# Patient Record
Sex: Female | Born: 1985 | Race: Black or African American | Hispanic: No | Marital: Married | Smoking: Never smoker
Health system: Southern US, Community
[De-identification: ages and names within clinical notes are randomized; demographics above are authoritative.]

## PROBLEM LIST (undated history)

## (undated) HISTORY — PX: ECTOPIC PREGNANCY SURGERY: SHX613

---

## 2004-06-19 ENCOUNTER — Inpatient Hospital Stay: Payer: Self-pay

## 2005-05-05 ENCOUNTER — Emergency Department: Payer: Self-pay | Admitting: Emergency Medicine

## 2006-06-24 ENCOUNTER — Emergency Department: Payer: Self-pay

## 2007-09-07 ENCOUNTER — Emergency Department: Payer: Self-pay | Admitting: Emergency Medicine

## 2007-09-08 ENCOUNTER — Ambulatory Visit: Payer: Self-pay | Admitting: Emergency Medicine

## 2007-09-26 ENCOUNTER — Emergency Department: Payer: Self-pay | Admitting: Emergency Medicine

## 2008-04-23 ENCOUNTER — Inpatient Hospital Stay: Payer: Self-pay | Admitting: Obstetrics and Gynecology

## 2010-12-04 ENCOUNTER — Ambulatory Visit: Payer: Self-pay | Admitting: Internal Medicine

## 2012-05-15 ENCOUNTER — Emergency Department: Payer: Self-pay | Admitting: Emergency Medicine

## 2012-05-15 LAB — COMPREHENSIVE METABOLIC PANEL
Alkaline Phosphatase: 130 U/L (ref 50–136)
Anion Gap: 8 (ref 7–16)
Bilirubin,Total: 0.4 mg/dL (ref 0.2–1.0)
Calcium, Total: 9 mg/dL (ref 8.5–10.1)
Chloride: 111 mmol/L — ABNORMAL HIGH (ref 98–107)
Co2: 24 mmol/L (ref 21–32)
Creatinine: 0.77 mg/dL (ref 0.60–1.30)
EGFR (African American): >60
Osmolality: 287 (ref 275–301)
Potassium: 3.8 mmol/L (ref 3.5–5.1)
Sodium: 143 mmol/L (ref 136–145)

## 2012-05-15 LAB — LIPASE, BLOOD: Lipase: 90 U/L (ref 73–393)

## 2012-05-15 LAB — CBC
HCT: 44.9 % (ref 35.0–47.0)
MCH: 26.7 pg (ref 26.0–34.0)
MCHC: 32.2 g/dL (ref 32.0–36.0)
Platelet: 322 10*3/uL (ref 150–440)

## 2013-03-22 ENCOUNTER — Emergency Department: Payer: Self-pay | Admitting: Emergency Medicine

## 2013-03-22 LAB — BASIC METABOLIC PANEL
Anion Gap: 4 — ABNORMAL LOW (ref 7–16)
BUN: 6 mg/dL — ABNORMAL LOW (ref 7–18)
Calcium, Total: 8.5 mg/dL (ref 8.5–10.1)
Co2: 22 mmol/L (ref 21–32)
EGFR (African American): >60
Glucose: 91 mg/dL (ref 65–99)

## 2013-03-22 LAB — CBC WITH DIFFERENTIAL/PLATELET
Basophil #: 0.1 10*3/uL (ref 0.0–0.1)
Eosinophil #: 0 10*3/uL (ref 0.0–0.7)
HGB: 13.4 g/dL (ref 12.0–16.0)
Lymphocyte #: 1.7 10*3/uL (ref 1.0–3.6)
Lymphocyte %: 18.1 %
MCH: 27.4 pg (ref 26.0–34.0)
MCHC: 33.6 g/dL (ref 32.0–36.0)
RBC: 4.88 10*6/uL (ref 3.80–5.20)
RDW: 14.6 % — ABNORMAL HIGH (ref 11.5–14.5)

## 2015-01-03 ENCOUNTER — Emergency Department
Admission: EM | Admit: 2015-01-03 | Discharge: 2015-01-03 | Disposition: A | Discharge status: Home / Self Care | Payer: Self-pay | Attending: Emergency Medicine | Admitting: Emergency Medicine

## 2015-01-03 ENCOUNTER — Encounter: Payer: Self-pay | Admitting: *Deleted

## 2015-01-03 ENCOUNTER — Emergency Department: Payer: Self-pay

## 2015-01-03 DIAGNOSIS — Z3202 Encounter for pregnancy test, result negative: Secondary | ICD-10-CM | POA: Insufficient documentation

## 2015-01-03 DIAGNOSIS — R1031 Right lower quadrant pain: Secondary | ICD-10-CM | POA: Insufficient documentation

## 2015-01-03 DIAGNOSIS — R109 Unspecified abdominal pain: Secondary | ICD-10-CM

## 2015-01-03 LAB — LIPASE, BLOOD: LIPASE: 23 U/L (ref 22–51)

## 2015-01-03 LAB — URINALYSIS COMPLETE WITH MICROSCOPIC (ARMC ONLY)
BILIRUBIN URINE: NEGATIVE
GLUCOSE, UA: NEGATIVE mg/dL
Ketones, ur: NEGATIVE mg/dL
Leukocytes, UA: NEGATIVE
Nitrite: NEGATIVE
Protein, ur: NEGATIVE mg/dL
Specific Gravity, Urine: 1.027 (ref 1.005–1.030)
pH: 5 (ref 5.0–8.0)

## 2015-01-03 LAB — CBC
HEMATOCRIT: 39.3 % (ref 35.0–47.0)
Hemoglobin: 12.8 g/dL (ref 12.0–16.0)
MCH: 27.1 pg (ref 26.0–34.0)
MCHC: 32.5 g/dL (ref 32.0–36.0)
MCV: 83.3 fL (ref 80.0–100.0)
PLATELETS: 308 10*3/uL (ref 150–440)
RBC: 4.72 MIL/uL (ref 3.80–5.20)
RDW: 14.2 % (ref 11.5–14.5)
WBC: 8.7 10*3/uL (ref 3.6–11.0)

## 2015-01-03 LAB — COMPREHENSIVE METABOLIC PANEL
ALK PHOS: 98 U/L (ref 38–126)
ALT: 16 U/L (ref 14–54)
AST: 20 U/L (ref 15–41)
Albumin: 4.1 g/dL (ref 3.5–5.0)
Anion gap: 7 (ref 5–15)
BILIRUBIN TOTAL: 0.5 mg/dL (ref 0.3–1.2)
BUN: 12 mg/dL (ref 6–20)
CALCIUM: 8.8 mg/dL — AB (ref 8.9–10.3)
CO2: 24 mmol/L (ref 22–32)
CREATININE: 0.68 mg/dL (ref 0.44–1.00)
Chloride: 107 mmol/L (ref 101–111)
Glucose, Bld: 105 mg/dL — ABNORMAL HIGH (ref 65–99)
Potassium: 3.6 mmol/L (ref 3.5–5.1)
Sodium: 138 mmol/L (ref 135–145)
TOTAL PROTEIN: 7.8 g/dL (ref 6.5–8.1)

## 2015-01-03 LAB — WET PREP, GENITAL
TRICH WET PREP: NONE SEEN
YEAST WET PREP: NONE SEEN

## 2015-01-03 LAB — CHLAMYDIA/NGC RT PCR (ARMC ONLY)
Chlamydia Tr: NOT DETECTED
N gonorrhoeae: NOT DETECTED

## 2015-01-03 LAB — POCT PREGNANCY, URINE: PREG TEST UR: NEGATIVE

## 2015-01-03 MED ORDER — TRAMADOL HCL 50 MG PO TABS: 50.0000 mg | ORAL_TABLET | Freq: Four times a day (QID) | ORAL | Status: AC | PRN

## 2015-01-03 NOTE — ED Provider Notes (Signed)
[facility] Emergency Department Provider Note  ____________________________________________  Time seen: On arrival  I have reviewed the triage vital signs and the nursing notes.   HISTORY  Chief Complaint Abdominal Pain    HPI Stephanie Christensen is a 29 y.o. female who presents with complaints of mild right sided abdominal pain. She reports it started yesterday and continued through the night. She denies fevers chills. No nausea no vomiting. No dysuria. No back pain. She reports a history of an ectopic pregnancy has been her only surgery. She is on Depo-Provera. She denies vaginal discharge. She believes that she is constipated     History reviewed. No pertinent past medical history.  There are no active problems to display for this patient.   History reviewed. No pertinent past surgical history.  Current Outpatient Rx  Name  Route  Sig  Dispense  Refill  . traMADol (ULTRAM) 50 MG tablet   Oral   Take 1 tablet (50 mg total) by mouth every 6 (six) hours as needed.   20 tablet   0     Allergies Review of patient's allergies indicates no known allergies.  No family history on file.  Social History Social History  Substance Use Topics  . Smoking status: Never Smoker   . Smokeless tobacco: Never Used  . Alcohol Use: Yes    Review of Systems  Constitutional: Negative for fever. Eyes: Negative for visual changes. ENT: Negative for sore throat Cardiovascular: Negative for chest pain. Respiratory: Negative for shortness of breath. Gastrointestinal: Negative for vomiting and diarrhea. Genitourinary: Negative for dysuria. Musculoskeletal: Negative for back pain. Skin: Negative for rash. Neurological: Negative for headaches  Psychiatric: No anxiety    ____________________________________________   PHYSICAL EXAM:  VITAL SIGNS: ED Triage Vitals  Enc Vitals Group     BP 01/03/15 0747 147/90 mmHg     Pulse Rate 01/03/15 0747 111    Resp 01/03/15 0747 18     Temp 01/03/15 0747 98.2 F (36.8 C)     Temp src --      SpO2 01/03/15 0747 98 %     Weight 01/03/15 0747 175 lb (79.379 kg)     Height 01/03/15 0747 5\' 3"  (1.6 m)     Head Cir --      Peak Flow --      Pain Score 01/03/15 0748 8     Pain Loc --      Pain Edu? --      Excl. in GC? --     Constitutional: Alert and oriented. Well appearing and in no distress. Eyes: Conjunctivae are normal.  ENT   Head: Normocephalic and atraumatic.   Mouth/Throat: Mucous membranes are moist. Cardiovascular: Normal rate, regular rhythm. Normal and symmetric distal pulses are present in all extremities. No murmurs, rubs, or gallops. Respiratory: Normal respiratory effort without tachypnea nor retractions. Breath sounds are clear and equal bilaterally.  Gastrointestinal: Soft and non-tender in all quadrants. No distention. There is no CVA tenderness. Benign abdominal exam Genitourinary: No cervicitis noted, no CMT Musculoskeletal: Nontender with normal range of motion in all extremities. No lower extremity tenderness nor edema. Neurologic:  Normal speech and language. No gross focal neurologic deficits are appreciated. Skin:  Skin is warm, dry and intact. No rash noted. Psychiatric: Mood and affect are normal. Patient exhibits appropriate insight and judgment.  ____________________________________________    LABS (pertinent positives/negatives)  Labs Reviewed  WET PREP, GENITAL - Abnormal; Notable for the following:  Clue Cells Wet Prep HPF POC FEW (*)    WBC, Wet Prep HPF POC MODERATE (*)    All other components within normal limits  COMPREHENSIVE METABOLIC PANEL - Abnormal; Notable for the following:    Glucose, Bld 105 (*)    Calcium 8.8 (*)    All other components within normal limits  URINALYSIS COMPLETEWITH MICROSCOPIC ([facility] ONLY) - Abnormal; Notable for the following:    Color, Urine YELLOW (*)    APPearance CLEAR (*)    Hgb urine dipstick 2+ (*)     Bacteria, UA RARE (*)    Squamous Epithelial / LPF 0-5 (*)    All other components within normal limits  CHLAMYDIA/NGC RT PCR ([facility] ONLY)  CBC  LIPASE, BLOOD  POCT PREGNANCY, URINE    ____________________________________________   EKG  None  ____________________________________________    RADIOLOGY I have personally reviewed any xrays that were ordered on this patient: None  ____________________________________________   PROCEDURES  Procedure(s) performed: none  Critical Care performed: none  ____________________________________________   INITIAL IMPRESSION / ASSESSMENT AND PLAN / ED COURSE  Pertinent labs & imaging results that were available during my care of the patient were reviewed by me and considered in my medical decision making (see chart for details).  Patient well-appearing and in no distress. Heart rate is 95 on my exam. She has no abdominal tenderness to palpation. We will check labs, urine and re-evaluate  ----------------------------------------- 11:08 AM on 01/03/2015 -----------------------------------------  Patient reports feeling slightly better. Her workup has been essentially benign area white blood cells noted on wet prep, we will call the patient when chlamydia and gonorrhea labs return. She has no ovary on the right she reports sotorsion is not a concern.  ____________________________________________   FINAL CLINICAL IMPRESSION(S) / ED DIAGNOSES  Final diagnoses:  Flank pain, acute  Right lower quadrant abdominal pain     Jene Every, MD 01/03/15 [phone]

## 2015-01-03 NOTE — ED Notes (Signed)
MD at bedside. 

## 2015-01-03 NOTE — Discharge Instructions (Signed)
Abdominal Pain, Women °Abdominal (stomach, pelvic, or belly) pain can be caused by many things. It is important to tell your doctor: °· The location of the pain. °· Does it come and go or is it present all the time? °· Are there things that start the pain (eating certain foods, exercise)? °· Are there other symptoms associated with the pain (fever, nausea, vomiting, diarrhea)? °All of this is helpful to know when trying to find the cause of the pain. °CAUSES  °· Stomach: virus or bacteria infection, or ulcer. °· Intestine: appendicitis (inflamed appendix), regional ileitis (Crohn's disease), ulcerative colitis (inflamed colon), irritable bowel syndrome, diverticulitis (inflamed diverticulum of the colon), or cancer of the stomach or intestine. °· Gallbladder disease or stones in the gallbladder. °· Kidney disease, kidney stones, or infection. °· Pancreas infection or cancer. °· Fibromyalgia (pain disorder). °· Diseases of the female organs: °¨ Uterus: fibroid (non-cancerous) tumors or infection. °¨ Fallopian tubes: infection or tubal pregnancy. °¨ Ovary: cysts or tumors. °¨ Pelvic adhesions (scar tissue). °¨ Endometriosis (uterus lining tissue growing in the pelvis and on the pelvic organs). °¨ Pelvic congestion syndrome (female organs filling up with blood just before the menstrual period). °¨ Pain with the menstrual period. °¨ Pain with ovulation (producing an egg). °¨ Pain with an IUD (intrauterine device, birth control) in the uterus. °¨ Cancer of the female organs. °· Functional pain (pain not caused by a disease, may improve without treatment). °· Psychological pain. °· Depression. °DIAGNOSIS  °Your doctor will decide the seriousness of your pain by doing an examination. °· Blood tests. °· X-rays. °· Ultrasound. °· CT scan (computed tomography, special type of X-ray). °· MRI (magnetic resonance imaging). °· Cultures, for infection. °· Barium enema (dye inserted in the large intestine, to better view it with  X-rays). °· Colonoscopy (looking in intestine with a lighted tube). °· Laparoscopy (minor surgery, looking in abdomen with a lighted tube). °· Major abdominal exploratory surgery (looking in abdomen with a large incision). °TREATMENT  °The treatment will depend on the cause of the pain.  °· Many cases can be observed and treated at home. °· Over-the-counter medicines recommended by your caregiver. °· Prescription medicine. °· Antibiotics, for infection. °· Birth control pills, for painful periods or for ovulation pain. °· Hormone treatment, for endometriosis. °· Nerve blocking injections. °· Physical therapy. °· Antidepressants. °· Counseling with a psychologist or psychiatrist. °· Minor or major surgery. °HOME CARE INSTRUCTIONS  °· Do not take laxatives, unless directed by your caregiver. °· Take over-the-counter pain medicine only if ordered by your caregiver. Do not take aspirin because it can cause an upset stomach or bleeding. °· Try a clear liquid diet (broth or water) as ordered by your caregiver. Slowly move to a bland diet, as tolerated, if the pain is related to the stomach or intestine. °· Have a thermometer and take your temperature several times a day, and record it. °· Bed rest and sleep, if it helps the pain. °· Avoid sexual intercourse, if it causes pain. °· Avoid stressful situations. °· Keep your follow-up appointments and tests, as your caregiver orders. °· If the pain does not go away with medicine or surgery, you may try: °¨ Acupuncture. °¨ Relaxation exercises (yoga, meditation). °¨ Group therapy. °¨ Counseling. °SEEK MEDICAL CARE IF:  °· You notice certain foods cause stomach pain. °· Your home care treatment is not helping your pain. °· You need stronger pain medicine. °· You want your IUD removed. °· You feel faint or   lightheaded. °· You develop nausea and vomiting. °· You develop a rash. °· You are having side effects or an allergy to your medicine. °SEEK IMMEDIATE MEDICAL CARE IF:  °· Your  pain does not go away or gets worse. °· You have a fever. °· Your pain is felt only in portions of the abdomen. The right side could possibly be appendicitis. The left lower portion of the abdomen could be colitis or diverticulitis. °· You are passing blood in your stools (bright red or black tarry stools, with or without vomiting). °· You have blood in your urine. °· You develop chills, with or without a fever. °· You pass out. °MAKE SURE YOU:  °· Understand these instructions. °· Will watch your condition. °· Will get help right away if you are not doing well or get worse. °Document Released: 03/04/2007 Document Revised: 09/21/2013 Document Reviewed: 03/24/2009 °[employer]® Patient Information ©2015 [employer]. This information is not intended to replace advice given to you by your health care provider. Make sure you discuss any questions you have with your health care provider. ° °

## 2015-01-03 NOTE — ED Notes (Signed)
Pt reports right sided abdominal pain radiating to back, pt reports being constipated,

## 2016-09-01 ENCOUNTER — Emergency Department
Admission: EM | Admit: 2016-09-01 | Discharge: 2016-09-01 | Disposition: A | Discharge status: Home / Self Care | Payer: BLUE CROSS/BLUE SHIELD | Attending: Emergency Medicine | Admitting: Emergency Medicine

## 2016-09-01 ENCOUNTER — Encounter: Payer: Self-pay | Admitting: Emergency Medicine

## 2016-09-01 DIAGNOSIS — Y999 Unspecified external cause status: Secondary | ICD-10-CM | POA: Diagnosis not present

## 2016-09-01 DIAGNOSIS — X58XXXA Exposure to other specified factors, initial encounter: Secondary | ICD-10-CM | POA: Insufficient documentation

## 2016-09-01 DIAGNOSIS — Y929 Unspecified place or not applicable: Secondary | ICD-10-CM | POA: Diagnosis not present

## 2016-09-01 DIAGNOSIS — Y939 Activity, unspecified: Secondary | ICD-10-CM | POA: Diagnosis not present

## 2016-09-01 DIAGNOSIS — S0592XA Unspecified injury of left eye and orbit, initial encounter: Secondary | ICD-10-CM | POA: Diagnosis present

## 2016-09-01 DIAGNOSIS — S0502XA Injury of conjunctiva and corneal abrasion without foreign body, left eye, initial encounter: Secondary | ICD-10-CM | POA: Diagnosis not present

## 2016-09-01 MED ORDER — TOBRAMYCIN 0.3 % OP SOLN: 2.0000 [drp] | OPHTHALMIC | 0 refills | Status: DC

## 2016-09-01 MED ORDER — FLUORESCEIN SODIUM 0.6 MG OP STRP
1.0000 | ORAL_STRIP | Freq: Once | OPHTHALMIC | Status: AC
  Administered 2016-09-01: 1 via OPHTHALMIC

## 2016-09-01 MED ORDER — TETRACAINE HCL 0.5 % OP SOLN
1.0000 [drp] | Freq: Once | OPHTHALMIC | Status: AC
  Administered 2016-09-01: 1 [drp] via OPHTHALMIC
  Filled 2016-09-01: qty 2

## 2016-09-01 MED ORDER — EYE WASH OPHTH SOLN
1.0000 [drp] | OPHTHALMIC | Status: DC | PRN
  Administered 2016-09-01: 1 [drp] via OPHTHALMIC
  Filled 2016-09-01: qty 118

## 2016-09-01 MED ORDER — FLUORESCEIN SODIUM 1 MG OP STRP
ORAL_STRIP | OPHTHALMIC | Status: AC
  Filled 2016-09-01: qty 1

## 2016-09-01 NOTE — ED Notes (Signed)
NAD noted at time of D/C. Pt denies questions or concerns. Pt ambulatory to the lobby at this time.  

## 2016-09-01 NOTE — ED Triage Notes (Signed)
States awoke yesterday am with L eye pain. States it feels like she has something in it but does not know of getting something in it. Sclera noted reddened.

## 2016-09-01 NOTE — Discharge Instructions (Signed)
Follow-up with [facility] if any continued problems on Monday. You will need to call and make an appointment. Use Tobrex ophthalmic solution 2 drops to your left eye every 4 hours while awake. You may take Tylenol or ibuprofen as needed for pain. Continue wearing your sunglasses for protection from bright lights.

## 2016-09-01 NOTE — ED Provider Notes (Signed)
[facility] Emergency Department Provider Note  ____________________________________________   First MD Initiated Contact with Patient 09/01/16 1218     (approximate)  I have reviewed the triage vital signs and the nursing notes.   HISTORY  Chief Complaint Eye Pain    HPI Stephanie Christensen is a 31 y.o. female is here complaining of left eye pain. Patient states that it began feeling like there was something in her eye. She went to her PCPs office yesterday where she had an eye exam. She was told that she did not have a corneal abrasion however this is Her awake most of the night and she continues to have photophobia. Patient states that she normally wears eyeglasses but these are broken and so she currently has nothing to wear. Patient denies the use of contacts. She denies any fever. She denies any previous eye problems or injuries. Patient rates her pain as 10 over 10. She states that she was not given any medication at her PCPs office.   History reviewed. No pertinent past medical history.  There are no active problems to display for this patient.   Past Surgical History:  Procedure Laterality Date  . ECTOPIC PREGNANCY SURGERY      Prior to Admission medications   Medication Sig Start Date End Date Taking? Authorizing Provider  tobramycin (TOBREX) 0.3 % ophthalmic solution Place 2 drops into the left eye every 4 (four) hours. 09/01/16   Tommi Rumps, PA-C    Allergies Patient has no known allergies.  No family history on file.  Social History Social History  Substance Use Topics  . Smoking status: Never Smoker  . Smokeless tobacco: Never Used  . Alcohol use Yes    Review of Systems Constitutional: No fever/chills Eyes: Left eye pain. ENT: No sore throat. Cardiovascular: Denies chest pain. Respiratory: Denies shortness of breath. Musculoskeletal: Negative for back pain. Neurological: Negative for headaches, focal weakness or  numbness.  10-point ROS otherwise negative.  ____________________________________________   PHYSICAL EXAM:  VITAL SIGNS: ED Triage Vitals  Enc Vitals Group     BP 09/01/16 1138 (!) 140/93     Pulse Rate 09/01/16 1138 91     Resp 09/01/16 1138 18     Temp 09/01/16 1138 98.6 F (37 C)     Temp Source 09/01/16 1138 Oral     SpO2 09/01/16 1138 100 %     Weight 09/01/16 1138 181 lb (82.1 kg)     Height 09/01/16 1138  (1.6 m)     Head Circumference --      Peak Flow --      Pain Score 09/01/16 1137 10     Pain Loc --      Pain Edu? --      Excl. in GC? --     Constitutional: Alert and oriented. Well appearing and in no acute distress.Patient is wearing sunglasses in the exam room. Eyes: Conjunctivae are normal for the right eye. Left eye is moderately injected.Marland Kitchen PERRL. EOMI. Head: Atraumatic. Nose: No congestion/rhinnorhea. Neck: No stridor.   Cardiovascular: Normal rate, regular rhythm. Grossly normal heart sounds.  Good peripheral circulation. Respiratory: Normal respiratory effort.  No retractions. Lungs CTAB. Musculoskeletal: Moves upper and lower 70s without difficulty. Normal gait was noted. Neurologic:  Normal speech and language. No gross focal neurologic deficits are appreciated. No gait instability. Skin:  Skin is warm, dry and intact. No rash noted. Psychiatric: Mood and affect are normal. Speech and behavior are  normal.  ____________________________________________   LABS (all labs ordered are listed, but only abnormal results are displayed)  Labs Reviewed - No data to display  PROCEDURES  Procedure(s) performed: Tetracaine was placed in the patient's left eye. Examination not show any foreign body. Fluorescein strip was applied. There was uptake on the cornea from approximately 3:00 position over to 9:00. There appears to be very superficial. Lid was inverted and no foreign body was noted. Eye was irrigated.  Procedures  Critical Care performed:  No  ____________________________________________   INITIAL IMPRESSION / ASSESSMENT AND PLAN / ED COURSE  Pertinent labs & imaging results that were available during my care of the patient were reviewed by me and considered in my medical decision making (see chart for details).  Patient was given a prescription for Tobrex ophthalmic solution 2 drops every 4 hours while awake. Patient may take Tylenol as needed for pain. She'll continue wearing her sunglasses as needed for protection from bright light. She is to follow-up with Dr. Sharman Crate who is on-call for [facility] if any continued problems on Monday.      ____________________________________________   FINAL CLINICAL IMPRESSION(S) / ED DIAGNOSES  Final diagnoses:  Abrasion of left cornea, initial encounter      NEW MEDICATIONS STARTED DURING THIS VISIT:  Discharge Medication List as of 09/01/2016 12:43 PM    START taking these medications   Details  tobramycin (TOBREX) 0.3 % ophthalmic solution Place 2 drops into the left eye every 4 (four) hours., Starting Sat 09/01/2016, Print         Note:  This document was prepared using Dragon voice recognition software and may include unintentional dictation errors.    Tommi Rumps, PA-C 09/01/16 1522    Nita Sickle, MD 09/03/16 [phone]

## 2016-10-21 ENCOUNTER — Encounter: Payer: Self-pay | Admitting: Emergency Medicine

## 2016-10-21 ENCOUNTER — Emergency Department
Admission: EM | Admit: 2016-10-21 | Discharge: 2016-10-21 | Disposition: A | Discharge status: Home / Self Care | Payer: BLUE CROSS/BLUE SHIELD | Attending: Emergency Medicine | Admitting: Emergency Medicine

## 2016-10-21 DIAGNOSIS — R002 Palpitations: Secondary | ICD-10-CM | POA: Diagnosis present

## 2016-10-21 LAB — CBC WITH DIFFERENTIAL/PLATELET
Basophils Absolute: 0.1 10*3/uL (ref 0–0.1)
Basophils Relative: 1 %
Eosinophils Absolute: 0 10*3/uL (ref 0–0.7)
Eosinophils Relative: 0 %
HEMATOCRIT: 40.5 % (ref 35.0–47.0)
HEMOGLOBIN: 13.3 g/dL (ref 12.0–16.0)
LYMPHS ABS: 3 10*3/uL (ref 1.0–3.6)
LYMPHS PCT: 32 %
MCH: 26.5 pg (ref 26.0–34.0)
MCHC: 32.9 g/dL (ref 32.0–36.0)
MCV: 80.6 fL (ref 80.0–100.0)
Monocytes Absolute: 0.9 10*3/uL (ref 0.2–0.9)
Monocytes Relative: 9 %
NEUTROS ABS: 5.5 10*3/uL (ref 1.4–6.5)
Neutrophils Relative %: 58 %
Platelets: 311 10*3/uL (ref 150–440)
RBC: 5.02 MIL/uL (ref 3.80–5.20)
RDW: 14.7 % — ABNORMAL HIGH (ref 11.5–14.5)
WBC: 9.5 10*3/uL (ref 3.6–11.0)

## 2016-10-21 LAB — COMPREHENSIVE METABOLIC PANEL
ALK PHOS: 97 U/L (ref 38–126)
ALT: 12 U/L — AB (ref 14–54)
AST: 16 U/L (ref 15–41)
Albumin: 4.2 g/dL (ref 3.5–5.0)
Anion gap: 6 (ref 5–15)
BILIRUBIN TOTAL: 0.4 mg/dL (ref 0.3–1.2)
BUN: 12 mg/dL (ref 6–20)
CALCIUM: 9.4 mg/dL (ref 8.9–10.3)
CHLORIDE: 108 mmol/L (ref 101–111)
CO2: 25 mmol/L (ref 22–32)
CREATININE: 0.83 mg/dL (ref 0.44–1.00)
Glucose, Bld: 98 mg/dL (ref 65–99)
Potassium: 3.9 mmol/L (ref 3.5–5.1)
Sodium: 139 mmol/L (ref 135–145)
Total Protein: 8.1 g/dL (ref 6.5–8.1)

## 2016-10-21 LAB — POCT PREGNANCY, URINE: Preg Test, Ur: NEGATIVE

## 2016-10-21 LAB — TROPONIN I

## 2016-10-21 NOTE — Discharge Instructions (Signed)
Please discuss getting a holter monitor (device to record your heart rhythm) with your primary care doctor. Please seek medical attention for any high fevers, chest pain, shortness of breath, change in behavior, persistent vomiting, bloody stool or any other new or concerning symptoms.

## 2016-10-21 NOTE — ED Triage Notes (Signed)
Patient states that she has been having hear palpitations times one month. Patient states that the palpitations started happing more frequently. Patient denies any chest pain.

## 2016-10-21 NOTE — ED Provider Notes (Signed)
[facility] Emergency Department Provider Note  _______________________________________   I have reviewed the triage vital signs and the nursing notes.   HISTORY  Chief Complaint Palpitations   History limited by: Not Limited   HPI Stephanie Christensen is a 31 y.o. female who presents to the emergency department today because of concerns for palpitations. The patient states that she has had them in the past but for the past month they've been more frequent. They're occurring daily. They're very brief in nature. Patient denies any pain or shortness of breath associated with it. She does states she has been under a lot of stress and anxiety recently.   History reviewed. No pertinent past medical history.  There are no active problems to display for this patient.   Past Surgical History:  Procedure Laterality Date  . ECTOPIC PREGNANCY SURGERY      Prior to Admission medications   Medication Sig Start Date End Date Taking? Authorizing Provider  tobramycin (TOBREX) 0.3 % ophthalmic solution Place 2 drops into the left eye every 4 (four) hours. 09/01/16   Tommi RumpsSummers, Rhonda L, PA-C    Allergies Patient has no known allergies.  No family history on file.  Social History Social History  Substance Use Topics  . Smoking status: Never Smoker  . Smokeless tobacco: Never Used  . Alcohol use Yes     Comment: occ    Review of Systems Constitutional: No fever/chills Eyes: No visual changes. ENT: No sore throat. Cardiovascular:Positive for palpitations Respiratory: Denies shortness of breath. Gastrointestinal: No abdominal pain.  No nausea, no vomiting.  No diarrhea.   Genitourinary: Negative for dysuria. Musculoskeletal: Negative for back pain. Skin: Negative for rash. Neurological: Negative for headaches, focal weakness or numbness.  ____________________________________________   PHYSICAL EXAM:  VITAL SIGNS: ED Triage Vitals [10/21/16 2145]  Enc  Vitals Group     BP (!) 141/86     Pulse Rate 95     Resp 18     Temp 98.3 F (36.8 C)     Temp Source Oral     SpO2 100 %     Weight 175 lb (79.4 kg)     Height 5\' 3"  (1.6 m)   Constitutional: Alert and oriented. Well appearing and in no distress. Eyes: Conjunctivae are normal.  ENT   Head: Normocephalic and atraumatic.   Nose: No congestion/rhinnorhea.   Mouth/Throat: Mucous membranes are moist.   Neck: No stridor. Hematological/Lymphatic/Immunilogical: No cervical lymphadenopathy. Cardiovascular: Normal rate, regular rhythm.  No murmurs, rubs, or gallops.  Respiratory: Normal respiratory effort without tachypnea nor retractions. Breath sounds are clear and equal bilaterally. No wheezes/rales/rhonchi. Gastrointestinal: Soft and non tender. No rebound. No guarding.  Genitourinary: Deferred Musculoskeletal: Normal range of motion in all extremities. No lower extremity edema. Neurologic:  Normal speech and language. No gross focal neurologic deficits are appreciated.  Skin:  Skin is warm, dry and intact. No rash noted. Psychiatric: Mood and affect are normal. Speech and behavior are normal. Patient exhibits appropriate insight and judgment.  ____________________________________________    LABS (pertinent positives/negatives)  Labs Reviewed  CBC WITH DIFFERENTIAL/PLATELET - Abnormal; Notable for the following:       Result Value   RDW 14.7 (*)    All other components within normal limits  COMPREHENSIVE METABOLIC PANEL - Abnormal; Notable for the following:    ALT 12 (*)    All other components within normal limits  TROPONIN I  POCT PREGNANCY, URINE     ____________________________________________  EKG  I, Phineas Semen, attending physician, personally viewed and interpreted this EKG  EKG Time: 2141 Rate: 100 Rhythm: normal sinus rhythm Axis: normal Intervals: qtc 438 QRS: narrow ST changes: no st elevation Impression: normal  ekg   ____________________________________________    RADIOLOGY  None  ____________________________________________   PROCEDURES  Procedures  ____________________________________________   INITIAL IMPRESSION / ASSESSMENT AND PLAN / ED COURSE  Pertinent labs & imaging results that were available during my care of the patient were reviewed by me and considered in my medical decision making (see chart for details).  Patient presented to the emergency department today with palpitations. EKG without any concerning findings. Blood work without any concerning electrolyte abnormalities. At this point I do feel patient is safe for discharge with follow-up with primary care. I did discuss Holter monitor with patient.  ____________________________________________   FINAL CLINICAL IMPRESSION(S) / ED DIAGNOSES  Final diagnoses:  Palpitations     Note: This dictation was prepared with Dragon dictation. Any transcriptional errors that result from this process are unintentional     Phineas Semen, MD 10/21/16 2254

## 2016-12-27 ENCOUNTER — Emergency Department
Admission: EM | Admit: 2016-12-27 | Discharge: 2016-12-27 | Disposition: A | Discharge status: Home / Self Care | Payer: BLUE CROSS/BLUE SHIELD | Attending: Emergency Medicine | Admitting: Emergency Medicine

## 2016-12-27 ENCOUNTER — Encounter: Payer: Self-pay | Admitting: Medical Oncology

## 2016-12-27 DIAGNOSIS — M79672 Pain in left foot: Secondary | ICD-10-CM

## 2016-12-27 MED ORDER — NAPROXEN 500 MG PO TABS
500.0000 mg | ORAL_TABLET | Freq: Two times a day (BID) | ORAL | Status: DC
Start: 2016-12-27 — End: 2017-09-17

## 2016-12-27 NOTE — ED Triage Notes (Signed)
Pt reports she began yesterday having numbness to her middle and 4th toe on her left foot. Denies injury/pain. Pt reports everything else is fine.

## 2016-12-27 NOTE — ED Notes (Signed)
Pt to ed with c/o left foot/toe numbness and tingling.  Pt states she noticed foot numbness last night, denies headache, denies other numbness or tingling to other parts of body. No facial drooping noted, no weakness noted.  Pt denies injury.

## 2016-12-27 NOTE — ED Provider Notes (Signed)
[facility] Emergency Department Provider Note   ____________________________________________   First MD Initiated Contact with Patient 12/27/16 [phone]     (approximate)  I have reviewed the triage vital signs and the nursing notes.   HISTORY  Chief Complaint Foot Pain    HPI Stephanie Christensen is a 31 y.o. female patient complaining of numbness dorsal aspect of the third and fourth digit left foot. Patient state the first time she wore high heels at work with prolonged standing. Patient stated initially there was pain followed by numbness. Patient thought she would get better overnight but awakened again with numbness and pain with ambulation and standing. Patient's states no pain at this time just numbness.   History reviewed. No pertinent past medical history.  There are no active problems to display for this patient.   Past Surgical History:  Procedure Laterality Date  . ECTOPIC PREGNANCY SURGERY      Prior to Admission medications   Medication Sig Start Date End Date Taking? Authorizing Provider  naproxen (NAPROSYN) 500 MG tablet Take 1 tablet (500 mg total) by mouth 2 (two) times daily with a meal. 12/27/16   Joni Reining, PA-C  tobramycin (TOBREX) 0.3 % ophthalmic solution Place 2 drops into the left eye every 4 (four) hours. 09/01/16   Tommi Rumps, PA-C    Allergies Patient has no known allergies.  No family history on file.  Social History Social History  Substance Use Topics  . Smoking status: Never Smoker  . Smokeless tobacco: Never Used  . Alcohol use Yes     Comment: occ    Review of Systems  Constitutional: No fever/chills Eyes: No visual changes. ENT: No sore throat. Cardiovascular: Denies chest pain. Respiratory: Denies shortness of breath. Gastrointestinal: No abdominal pain.  No nausea, no vomiting.  No diarrhea.  No constipation. Genitourinary: Negative for dysuria. Musculoskeletal: Negative for back  pain. Skin: Negative for rash. Neurological: Numbness 4th and 5th left foot ____________________________________________   PHYSICAL EXAM:  VITAL SIGNS: ED Triage Vitals  Enc Vitals Group     BP 12/27/16 0803 140/87     Pulse Rate 12/27/16 0803 100     Resp 12/27/16 0803 12     Temp 12/27/16 0803 98.8 F (37.1 C)     Temp Source 12/27/16 0803 Oral     SpO2 12/27/16 0803 100 %     Weight 12/27/16 0804 175 lb (79.4 kg)     Height 12/27/16 0804 5\' 3"  (1.6 m)     Head Circumference --      Peak Flow --      Pain Score 12/27/16 0805 0     Pain Loc --      Pain Edu? --      Excl. in GC? --     Constitutional: Alert and oriented. Well appearing and in no acute distress. Cardiovascular: Normal rate, regular rhythm. Grossly normal heart sounds.  Good peripheral circulation. Respiratory: Normal respiratory effort.  No retractions. Lungs CTAB. Musculoskeletal: Obvious deformity to the left foot. No obvious edema.  Neurologic:  Normal speech and language. No gross focal neurologic deficits are appreciated. No gait instability. Skin:  Skin is warm, dry and intact. No rash noted. Psychiatric: Mood and affect are normal. Speech and behavior are normal.  ____________________________________________   LABS (all labs ordered are listed, but only abnormal results are displayed)  Labs Reviewed - No data to display ____________________________________________  EKG   ____________________________________________  RADIOLOGY  No  results found.  ____________________________________________   PROCEDURES  Procedure(s) performed: None  Procedures  Critical Care performed: No  ____________________________________________   INITIAL IMPRESSION / ASSESSMENT AND PLAN / ED COURSE  Pertinent labs & imaging results that were available during my care of the patient were reviewed by me and considered in my medical decision making (see chart for details).  Patient presents for  intermitting pain and numbness to the left foot. Incident occurred status post. He wasn't prolonged standing at work. Patient complains consistent inflammatory process. No imaging required. Patient given discharge care instructions and a work note. Patient given a prescription for naproxen and advised to follow-up with podiatry is no improvement in one week.      ____________________________________________   FINAL CLINICAL IMPRESSION(S) / ED DIAGNOSES  Final diagnoses:  Foot pain, left      NEW MEDICATIONS STARTED DURING THIS VISIT:  Discharge Medication List as of 12/27/2016  8:30 AM    START taking these medications   Details  naproxen (NAPROSYN) 500 MG tablet Take 1 tablet (500 mg total) by mouth 2 (two) times daily with a meal., Starting Thu 12/27/2016, Print         Note:  This document was prepared using Dragon voice recognition software and may include unintentional dictation errors.    Joni ReiningSmith, Ronald K, PA-C 12/27/16 [id number]    Phineas SemenGoodman, Graydon, MD 12/27/16 [phone]

## 2017-09-17 ENCOUNTER — Emergency Department
Admission: EM | Admit: 2017-09-17 | Discharge: 2017-09-17 | Disposition: A | Discharge status: Home / Self Care | Payer: Managed Care, Other (non HMO) | Attending: Emergency Medicine | Admitting: Emergency Medicine

## 2017-09-17 ENCOUNTER — Emergency Department: Payer: Managed Care, Other (non HMO)

## 2017-09-17 ENCOUNTER — Other Ambulatory Visit: Payer: Self-pay

## 2017-09-17 DIAGNOSIS — M542 Cervicalgia: Secondary | ICD-10-CM | POA: Insufficient documentation

## 2017-09-17 MED ORDER — METHOCARBAMOL 500 MG PO TABS: 500.0000 mg | ORAL_TABLET | Freq: Four times a day (QID) | ORAL | 0 refills | Status: DC

## 2017-09-17 MED ORDER — MELOXICAM 15 MG PO TABS: 15.0000 mg | ORAL_TABLET | Freq: Every day | ORAL | 0 refills | Status: DC

## 2017-09-17 NOTE — ED Notes (Signed)
See triage note. Presents with pain to upper back/between shoulder blades for the past 2 months  Denies any injury that she is aware of .low grade fever noted on arrival  Is able to turn head and flex and extend her neck

## 2017-09-17 NOTE — ED Provider Notes (Signed)
[facility] Emergency Department Provider Note  ____________________________________________  Time seen: Approximately 7:12 PM  I have reviewed the triage vital signs and the nursing notes.   HISTORY  Chief Complaint Back Pain    HPI Stephanie Christensen is a 32 y.o. female who presents emergency department complaining of ongoing neck pain.  Patient reports that over the past 2 months she has had neck pain.  Origin was nontraumatic in nature.  Patient reports that she does start a new job, spending significant periods of her day on a computer.  Patient denies any radicular symptoms.  She denies any headache, visual changes, chest pain, shortness of breath.  Patient reports that it is an aching sensation.  Patient reports feeling a "bulge" in the back of her neck.  Patient does not believe that this initially arose with the onset of her pain however.  Patient is taken intermittent over-the-counter medication but no daily or chronic over-the-counter medications for this complaint.  No other complaints at this time.  History reviewed. No pertinent past medical history.  There are no active problems to display for this patient.   Past Surgical History:  Procedure Laterality Date  . ECTOPIC PREGNANCY SURGERY      Prior to Admission medications   Medication Sig Start Date End Date Taking? Authorizing Provider  meloxicam (MOBIC) 15 MG tablet Take 1 tablet (15 mg total) by mouth daily. 09/17/17   Cuthriell, Delorise Royals, PA-C  methocarbamol (ROBAXIN) 500 MG tablet Take 1 tablet (500 mg total) by mouth 4 (four) times daily. 09/17/17   Cuthriell, Delorise Royals, PA-C    Allergies Patient has no known allergies.  No family history on file.  Social History Social History   Tobacco Use  . Smoking status: Never Smoker  . Smokeless tobacco: Never Used  Substance Use Topics  . Alcohol use: Yes    Comment: occ  . Drug use: No     Review of Systems  Constitutional: No  fever/chills Eyes: No visual changes.  Cardiovascular: no chest pain. Respiratory: no cough. No SOB. Gastrointestinal: No abdominal pain.  No nausea, no vomiting.  Musculoskeletal: Ongoing neck pain x2 months. Skin: Negative for rash, abrasions, lacerations, ecchymosis. Neurological: Negative for headaches, focal weakness or numbness. 10-point ROS otherwise negative.  ____________________________________________   PHYSICAL EXAM:  VITAL SIGNS: ED Triage Vitals  Enc Vitals Group     BP 09/17/17 1838 140/88     Pulse Rate 09/17/17 1838 84     Resp 09/17/17 1838 18     Temp 09/17/17 1838 99.4 F (37.4 C)     Temp Source 09/17/17 1838 Oral     SpO2 09/17/17 1838 97 %     Weight 09/17/17 1837 180 lb (81.6 kg)     Height 09/17/17 1837  (1.6 m)     Head Circumference --      Peak Flow --      Pain Score 09/17/17 1842 6     Pain Loc --      Pain Edu? --      Excl. in GC? --      Constitutional: Alert and oriented. Well appearing and in no acute distress. Eyes: Conjunctivae are normal. PERRL. EOMI. Head: Atraumatic. ENT:      Ears:       Nose: No congestion/rhinnorhea.      Mouth/Throat: Mucous membranes are moist.  Neck: No stridor.  No cervical spine tenderness to palpation.  On palpation of cervical spine, exaggerated  lordosis is palpated, however no appreciable abnormality or step-off.  No tenderness to palpation of bilateral paraspinal muscle groups.  Full range of motion bilateral shoulders.  Radial pulse intact bilateral upper extremities.  Sensation intact and equal all dermatomal distributions bilateral upper extremities.  Cardiovascular: Normal rate, regular rhythm. Normal S1 and S2.  Good peripheral circulation. Respiratory: Normal respiratory effort without tachypnea or retractions. Lungs CTAB. Good air entry to the bases with no decreased or absent breath sounds. Musculoskeletal: Full range of motion to all extremities. No gross deformities  appreciated. Neurologic:  Normal speech and language. No gross focal neurologic deficits are appreciated.  Skin:  Skin is warm, dry and intact. No rash noted. Psychiatric: Mood and affect are normal. Speech and behavior are normal. Patient exhibits appropriate insight and judgement.   ____________________________________________   LABS (all labs ordered are listed, but only abnormal results are displayed)  Labs Reviewed - No data to display ____________________________________________  EKG   ____________________________________________  RADIOLOGY Festus Barren Cuthriell, personally viewed and evaluated these images (plain radiographs) as part of my medical decision making, as well as reviewing the written report by the radiologist.  Agree with radiologist finding of no acute osseous abnormality.  Dg Cervical Spine 2-3 Views  Result Date: 09/17/2017 CLINICAL DATA:  Neck pain x2 months, no known injury EXAM: CERVICAL SPINE - 2-3 VIEW COMPARISON:  None. FINDINGS: Normal cervical lordosis. No evidence of fracture or dislocation. Vertebral body heights and intervertebral disc spaces are maintained. Dens appears intact. Lateral masses of C1 are symmetric. No prevertebral soft tissue swelling. Visualized lung apices are clear. IMPRESSION: Negative cervical spine radiographs. Electronically Signed   By: Charline Bills M.D.   On: 09/17/2017 20:03    ____________________________________________    PROCEDURES  Procedure(s) performed:    Procedures    Medications - No data to display   ____________________________________________   INITIAL IMPRESSION / ASSESSMENT AND PLAN / ED COURSE  Pertinent labs & imaging results that were available during my care of the patient were reviewed by me and considered in my medical decision making (see chart for details).  Review of the [place] CSRS was performed in accordance of the [facility] prior to dispensing any controlled drugs.     Patient's  diagnosis is consistent with cervicalgia.  Patient presents with 41-month history of ongoing neck pain.  I felt that this was likely positional as patient started a new job, spending long hours but no her computer screen.  Exam is reassuring.  X-ray was ordered to evaluate spacing of the inter vertebral joint space.  X-ray reveals no acute osseous abnormality.  No indication for further work-up.. Patient will be discharged home with prescriptions for meloxicam and Robaxin. Patient is to follow up with primary care as needed or otherwise directed. Patient is given ED precautions to return to the ED for any worsening or new symptoms.     ____________________________________________  FINAL CLINICAL IMPRESSION(S) / ED DIAGNOSES  Final diagnoses:  Cervicalgia      NEW MEDICATIONS STARTED DURING THIS VISIT:  ED Discharge Orders        Ordered    meloxicam (MOBIC) 15 MG tablet  Daily     09/17/17 2024    methocarbamol (ROBAXIN) 500 MG tablet  4 times daily     09/17/17 2024          This chart was dictated using voice recognition software/Dragon. Despite best efforts to proofread, errors can occur which can change the meaning. Any change  was purely unintentional.    Racheal Patches, PA-C 09/17/17 2031    Minna Antis, MD 09/17/17 2328

## 2017-09-17 NOTE — ED Triage Notes (Signed)
Pt c/o upper back/shoulder and neck pain for the past 2 months.

## 2018-06-13 IMAGING — CR DG CERVICAL SPINE 2 OR 3 VIEWS
1 series · 4 of 4 positions shown · non-contrast
Comparison: None.

CLINICAL DATA: Neck pain x2 months, no known injury

EXAM:
CERVICAL SPINE - 2-3 VIEW

[Series 1: dg cervical spine 2 or 3 views · 0.14mm/px · 4 of 4 slices shown]
[im 1/4]
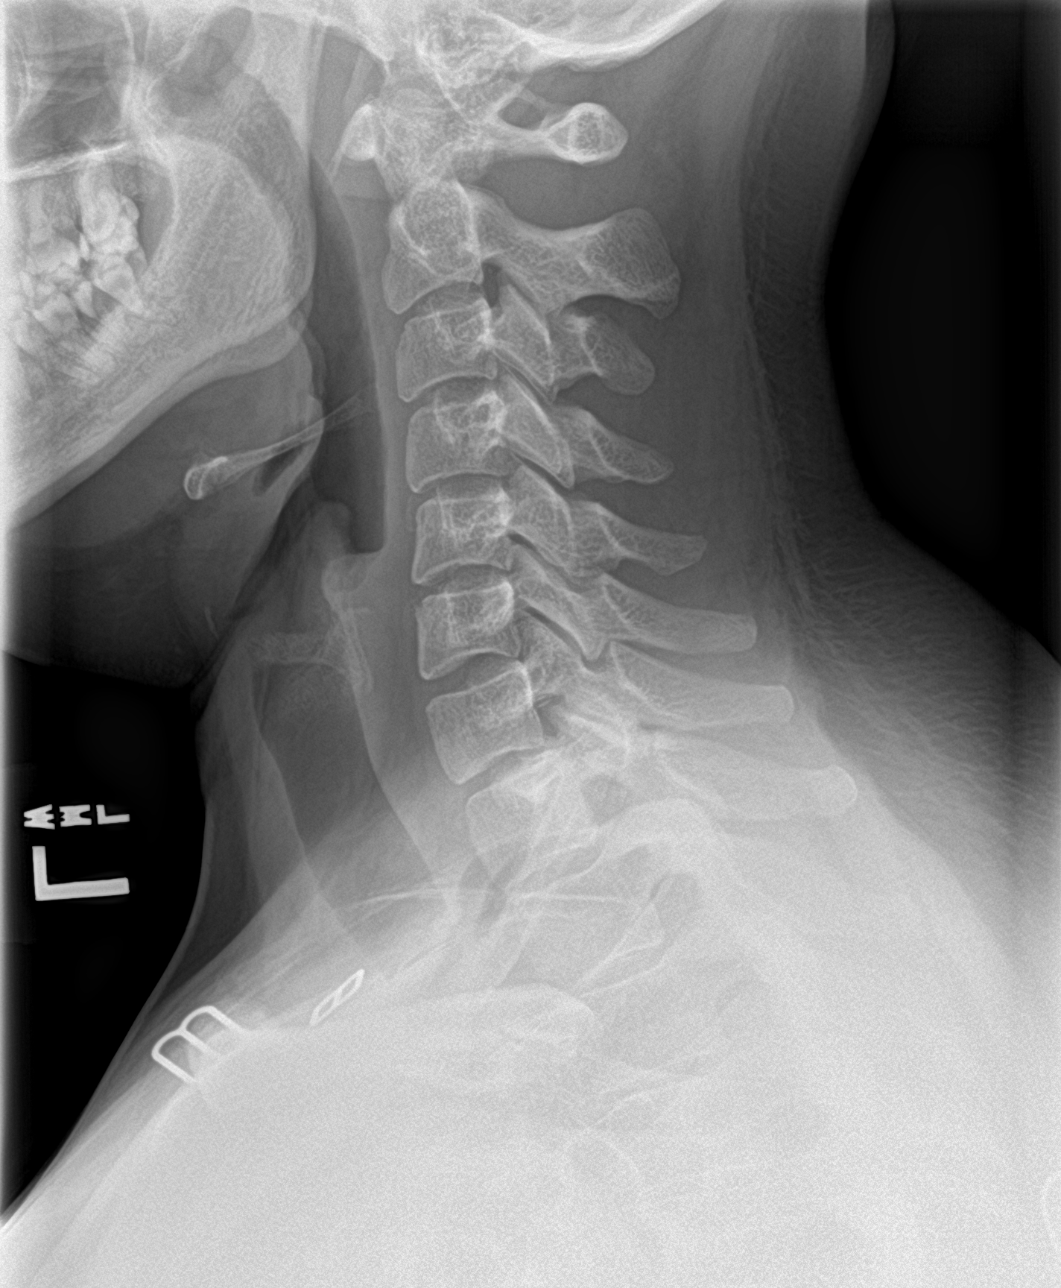
[im 2/4]
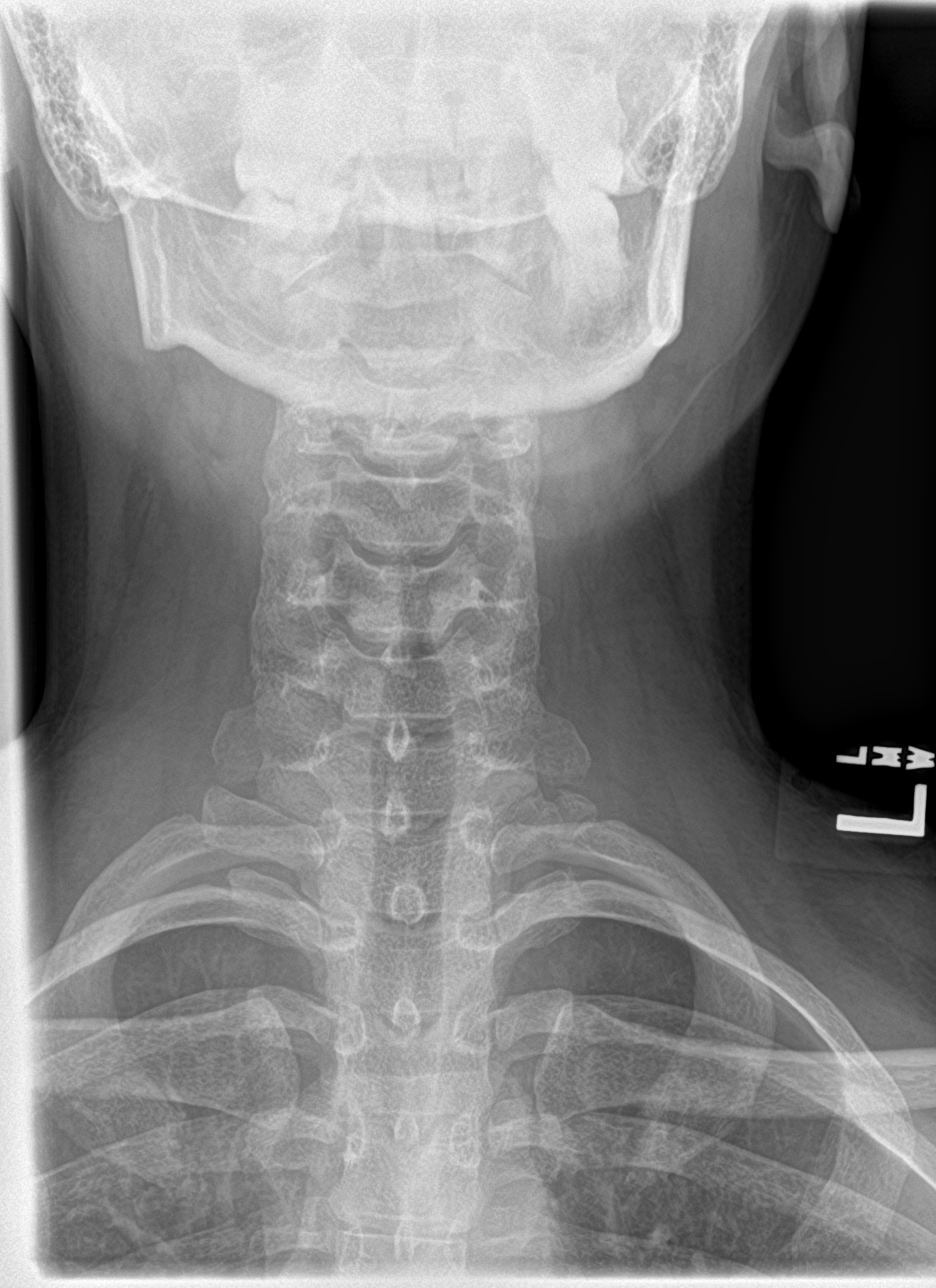
[im 3/4]
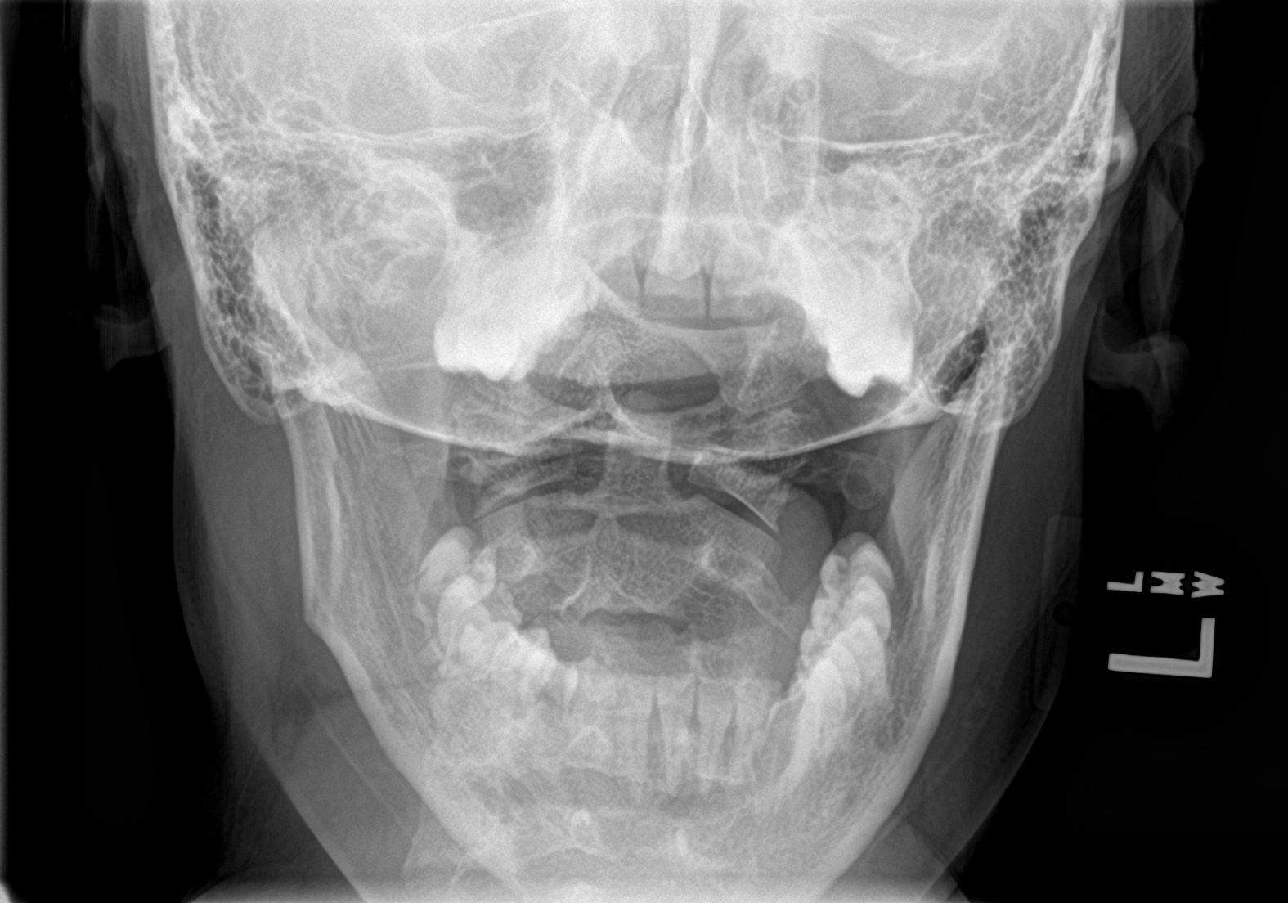
[im 4/4]
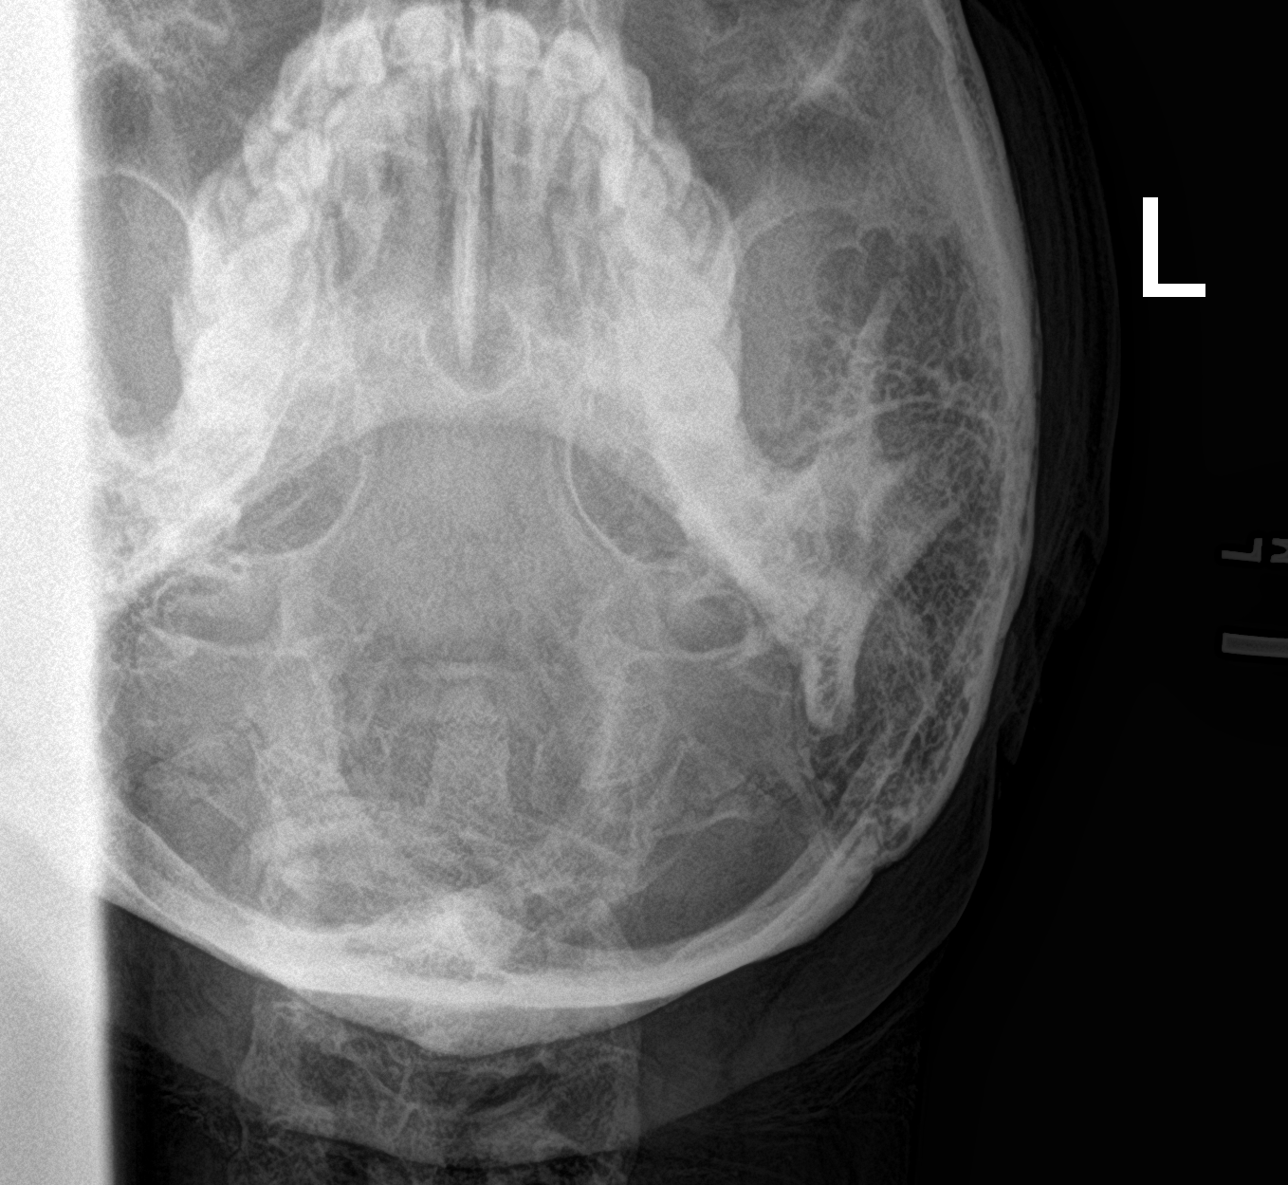

[4 of 4 positions shown; findings below may reference images not displayed]

FINDINGS: Normal cervical lordosis.

No evidence of fracture or dislocation. Vertebral body heights and
intervertebral disc spaces are maintained. Dens appears intact.
Lateral masses of C1 are symmetric.

No prevertebral soft tissue swelling.

Visualized lung apices are clear.
IMPRESSION: Negative cervical spine radiographs.

## 2019-12-14 ENCOUNTER — Emergency Department
Admission: EM | Admit: 2019-12-14 | Discharge: 2019-12-14 | Disposition: A | Discharge status: Home / Self Care | Payer: Managed Care, Other (non HMO) | Attending: Emergency Medicine | Admitting: Emergency Medicine

## 2019-12-14 ENCOUNTER — Other Ambulatory Visit: Payer: Self-pay

## 2019-12-14 DIAGNOSIS — M542 Cervicalgia: Secondary | ICD-10-CM | POA: Diagnosis not present

## 2019-12-14 DIAGNOSIS — R519 Headache, unspecified: Secondary | ICD-10-CM | POA: Diagnosis not present

## 2019-12-14 MED ORDER — LIDOCAINE 5 % EX PTCH: 1.0000 | MEDICATED_PATCH | Freq: Two times a day (BID) | CUTANEOUS | 0 refills | Status: AC

## 2019-12-14 NOTE — ED Notes (Signed)
AAOx3.  Skin warm and dry.  NAD 

## 2019-12-14 NOTE — ED Triage Notes (Signed)
Pt c/o neck and head pain for the past month, denies any fever or other sx.

## 2019-12-14 NOTE — ED Provider Notes (Signed)
[facility] Emergency Department Provider Note   ____________________________________________   First MD Initiated Contact with Patient 12/14/19 [phone]     (approximate)  I have reviewed the triage vital signs and the nursing notes.   HISTORY  Chief Complaint Neck Pain    HPI Stephanie Christensen is a 34 y.o. female with no significant past medical history who presents to the ED complaining of neck pain.  Patient reports that she has been dealing with intermittent pain in the middle of her lower neck for about the past month.  She describes it as a dull ache that can radiate down into her upper back or into the back of her head.  It will sometimes be associated with a headache but is not exacerbated or alleviated by nothing in particular.  She denies any numbness or weakness in her extremities and has not had any recent trauma to her head or neck.  She states she dealt with something similar a year or so ago, was evaluated by her PCP at that time with unremarkable findings.        History reviewed. No pertinent past medical history.  There are no problems to display for this patient.   Past Surgical History:  Procedure Laterality Date  . ECTOPIC PREGNANCY SURGERY      Prior to Admission medications   Medication Sig Start Date End Date Taking? Authorizing Provider  lidocaine (LIDODERM) 5 % Place 1 patch onto the skin every 12 (twelve) hours. Remove & Discard patch within 12 hours or as directed by MD 12/14/19 12/13/20  Chesley Noon, MD    Allergies Patient has no known allergies.  No family history on file.  Social History Social History   Tobacco Use  . Smoking status: Never Smoker  . Smokeless tobacco: Never Used  Substance Use Topics  . Alcohol use: Yes    Comment: occ  . Drug use: No    Review of Systems  Constitutional: No fever/chills Eyes: No visual changes. ENT: No sore throat. Cardiovascular: Denies chest pain. Respiratory:  Denies shortness of breath. Gastrointestinal: No abdominal pain.  No nausea, no vomiting.  No diarrhea.  No constipation. Genitourinary: Negative for dysuria. Musculoskeletal: Negative for back pain.  Positive for neck pain. Skin: Negative for rash. Neurological: Positive for headaches, negative for focal weakness or numbness.  ____________________________________________   PHYSICAL EXAM:  VITAL SIGNS: ED Triage Vitals  Enc Vitals Group     BP 12/14/19 0820 (!) 133/90     Pulse Rate 12/14/19 0820 101     Resp 12/14/19 0820 17     Temp 12/14/19 0820 99.3 F (37.4 C)     Temp Source 12/14/19 0820 Oral     SpO2 12/14/19 0820 100 %     Weight 12/14/19 0821 200 lb (90.7 kg)     Height 12/14/19 0821 5\' 3"  (1.6 m)     Head Circumference --      Peak Flow --      Pain Score 12/14/19 0821 7     Pain Loc --      Pain Edu? --      Excl. in GC? --     Constitutional: Alert and oriented. Eyes: Conjunctivae are normal. Head: Atraumatic. Nose: No congestion/rhinnorhea. Mouth/Throat: Mucous membranes are moist. Neck: Normal ROM.  No midline cervical spine or paraspinal tenderness. Cardiovascular: Normal rate, regular rhythm. Grossly normal heart sounds.  2+ radial pulses bilaterally. Respiratory: Normal respiratory effort.  No retractions. Lungs CTAB.  Gastrointestinal: Soft and nontender. No distention. Genitourinary: deferred Musculoskeletal: No lower extremity tenderness nor edema. Neurologic:  Normal speech and language. No gross focal neurologic deficits are appreciated. Skin:  Skin is warm, dry and intact. No rash noted. Psychiatric: Mood and affect are normal. Speech and behavior are normal.  ____________________________________________   LABS (all labs ordered are listed, but only abnormal results are displayed)  Labs Reviewed - No data to display   PROCEDURES  Procedure(s) performed (including Critical  Care):  Procedures   ____________________________________________   INITIAL IMPRESSION / ASSESSMENT AND PLAN / ED COURSE       34 year old female with no significant past medical history presents to the ED complaining of intermittent pain in the middle of her neck over the past month.  She is neurovascularly intact to both upper extremities on exam and no focal neurologic deficits noted.  No headache currently and I doubt SAH or meningitis, she has full range of motion of her neck with minimal discomfort.  Given reassuring exam, no advanced imaging indicated at this time and I suspect mild cervical strain.  She was prescribed Lidoderm patches and is appropriate for discharge home with PCP follow-up, patient agrees with plan.      ____________________________________________   FINAL CLINICAL IMPRESSION(S) / ED DIAGNOSES  Final diagnoses:  Neck pain     ED Discharge Orders         Ordered    lidocaine (LIDODERM) 5 %  Every 12 hours     Discontinue  Reprint     12/14/19 0924           Note:  This document was prepared using Dragon voice recognition software and may include unintentional dictation errors.   Chesley Noon, MD 12/14/19 0930

## 2019-12-14 NOTE — ED Notes (Signed)
See triage note  Presents with pain to back of neck and head  Denies any injury  States she also has intermittent dizziness

## 2021-03-16 ENCOUNTER — Other Ambulatory Visit: Payer: Self-pay

## 2021-03-16 ENCOUNTER — Emergency Department
Admission: EM | Admit: 2021-03-16 | Discharge: 2021-03-16 | Disposition: A | Discharge status: Home / Self Care | Payer: Managed Care, Other (non HMO) | Attending: Emergency Medicine | Admitting: Emergency Medicine

## 2021-03-16 ENCOUNTER — Encounter: Payer: Self-pay | Admitting: Emergency Medicine

## 2021-03-16 DIAGNOSIS — Z20822 Contact with and (suspected) exposure to covid-19: Secondary | ICD-10-CM | POA: Diagnosis not present

## 2021-03-16 DIAGNOSIS — K529 Noninfective gastroenteritis and colitis, unspecified: Secondary | ICD-10-CM | POA: Diagnosis not present

## 2021-03-16 DIAGNOSIS — R109 Unspecified abdominal pain: Secondary | ICD-10-CM | POA: Diagnosis present

## 2021-03-16 LAB — COMPREHENSIVE METABOLIC PANEL
ALT: 14 U/L (ref 0–44)
AST: 16 U/L (ref 15–41)
Albumin: 3.9 g/dL (ref 3.5–5.0)
Alkaline Phosphatase: 109 U/L (ref 38–126)
Anion gap: 6 (ref 5–15)
BUN: 11 mg/dL (ref 6–20)
CO2: 23 mmol/L (ref 22–32)
Calcium: 9.1 mg/dL (ref 8.9–10.3)
Chloride: 109 mmol/L (ref 98–111)
Creatinine, Ser: 0.92 mg/dL (ref 0.44–1.00)
GFR, Estimated: >60 mL/min (ref >60)
Glucose, Bld: 145 mg/dL — ABNORMAL HIGH (ref 70–99)
Potassium: 3.6 mmol/L (ref 3.5–5.1)
Sodium: 138 mmol/L (ref 135–145)
Total Bilirubin: 0.6 mg/dL (ref 0.3–1.2)
Total Protein: 8 g/dL (ref 6.5–8.1)

## 2021-03-16 LAB — URINALYSIS, ROUTINE W REFLEX MICROSCOPIC
Bilirubin Urine: NEGATIVE
Glucose, UA: NEGATIVE mg/dL
Ketones, ur: NEGATIVE mg/dL
Leukocytes,Ua: NEGATIVE
Nitrite: NEGATIVE
Protein, ur: NEGATIVE mg/dL
Specific Gravity, Urine: 1.023 (ref 1.005–1.030)
pH: 6 (ref 5.0–8.0)

## 2021-03-16 LAB — CBC
HCT: 38.4 % (ref 36.0–46.0)
Hemoglobin: 12.7 g/dL (ref 12.0–15.0)
MCH: 27.3 pg (ref 26.0–34.0)
MCHC: 33.1 g/dL (ref 30.0–36.0)
MCV: 82.4 fL (ref 80.0–100.0)
Platelets: 373 10*3/uL (ref 150–400)
RBC: 4.66 MIL/uL (ref 3.87–5.11)
RDW: 14.4 % (ref 11.5–15.5)
WBC: 8.3 10*3/uL (ref 4.0–10.5)
nRBC: 0 % (ref 0.0–0.2)

## 2021-03-16 LAB — RESP PANEL BY RT-PCR (FLU A&B, COVID) ARPGX2
Influenza A by PCR: NEGATIVE
Influenza B by PCR: NEGATIVE
SARS Coronavirus 2 by RT PCR: NEGATIVE

## 2021-03-16 LAB — LIPASE, BLOOD: Lipase: 39 U/L (ref 11–51)

## 2021-03-16 LAB — PREGNANCY, URINE: Preg Test, Ur: NEGATIVE

## 2021-03-16 MED ORDER — ONDANSETRON 4 MG PO TBDP: 4.0000 mg | ORAL_TABLET | Freq: Three times a day (TID) | ORAL | 0 refills | Status: DC | PRN

## 2021-03-16 NOTE — ED Provider Notes (Signed)
[facility] Emergency Department Provider Note  Time seen: 11:28 AM  I have reviewed the triage vital signs and the nursing notes.   HISTORY  Chief Complaint Abdominal Pain and Nausea  HPI Stephanie Christensen is a 35 y.o. female with no past medical history who presents to the emergency department for nausea abdominal discomfort and loose stool.  According to the patient since Sunday she has been experiencing intermittent nausea as well as abdominal cramping and occasional episodes of diarrhea.  Patient is here with her daughter who has similar symptoms that began approximately 2 days after her own symptoms.  Denies any fever.  Denies any current abdominal pain.  No dysuria.  Largely negative review of systems otherwise.   History reviewed. No pertinent past medical history.  There are no problems to display for this patient.   Past Surgical History:  Procedure Laterality Date   ECTOPIC PREGNANCY SURGERY      Prior to Admission medications   Not on File    No Known Allergies  No family history on file.  Social History Social History   Tobacco Use   Smoking status: Never   Smokeless tobacco: Never  Substance Use Topics   Alcohol use: Yes    Comment: occ   Drug use: No    Review of Systems Constitutional: Negative for fever. Cardiovascular: Negative for chest pain. Respiratory: Negative for shortness of breath. Gastrointestinal: Positive for intermittent mild abdominal dull discomfort.  Intermittent nausea but no vomiting.  Occasional episodes of loose stool. Genitourinary: Negative for urinary compaints Musculoskeletal: Negative for musculoskeletal complaints Neurological: Negative for headache All other ROS negative  ____________________________________________   PHYSICAL EXAM:  VITAL SIGNS: ED Triage Vitals  Enc Vitals Group     BP 03/16/21 0951 (!) 148/94     Pulse Rate 03/16/21 0951 98     Resp 03/16/21 0951 16     Temp  03/16/21 0951 98.6 F (37 C)     Temp Source 03/16/21 0951 Oral     SpO2 03/16/21 0951 100 %     Weight 03/16/21 0952 196 lb (88.9 kg)     Height 03/16/21 0952 5\' 3"  (1.6 m)     Head Circumference --      Peak Flow --      Pain Score 03/16/21 0952 6     Pain Loc --      Pain Edu? --      Excl. in GC? --    Constitutional: Alert and oriented. Well appearing and in no distress. Eyes: Normal exam ENT      Head: Normocephalic and atraumatic.      Mouth/Throat: Mucous membranes are moist. Cardiovascular: Normal rate, regular rhythm.  Respiratory: Normal respiratory effort without tachypnea nor retractions. Breath sounds are clear  Gastrointestinal: Soft, no reaction to abdominal palpation denies tenderness currently.  No rebound guarding or distention. Musculoskeletal: Nontender with normal range of motion in all extremities.  Neurologic:  Normal speech and language. No gross focal neurologic deficits  Skin:  Skin is warm, dry and intact.  Psychiatric: Mood and affect are normal.   ____________________________________________   INITIAL IMPRESSION / ASSESSMENT AND PLAN / ED COURSE  Pertinent labs & imaging results that were available during my care of the patient were reviewed by me and considered in my medical decision making (see chart for details).   Patient presents emergency department for nausea intermittent abdominal cramping and loose stool.  Daughter is here with similar symptoms.  Benign abdominal exam reassuring vitals.  Highly suspect gastroenteritis.  We will treat with Zofran to be used as needed.  Urinalysis is pending.  We will send a COVID/flu swab as a precaution but patient does not need to wait for the results we will follow-up on MyChart.  Urinalysis is normal.  Patient will be discharged.  Stephanie Christensen was evaluated in Emergency Department on 03/16/2021 for the symptoms described in the history of present illness. She was evaluated in the context of the  global COVID-19 pandemic, which necessitated consideration that the patient might be at risk for infection with the SARS-CoV-2 virus that causes COVID-19. Institutional protocols and algorithms that pertain to the evaluation of patients at risk for COVID-19 are in a state of rapid change based on information released by regulatory bodies including the CDC and federal and state organizations. These policies and algorithms were followed during the patient's care in the ED.  ____________________________________________   FINAL CLINICAL IMPRESSION(S) / ED DIAGNOSES  Gastroenteritis   Minna Antis, MD 03/16/21 1221

## 2021-03-16 NOTE — ED Triage Notes (Signed)
Pt to ED via POV c/o abdominal pain and nausea since Sunday. Pt denies vomiting, diarrhea, or fever. Pt is in NAD.

## 2021-12-22 ENCOUNTER — Emergency Department
Admission: EM | Admit: 2021-12-22 | Discharge: 2021-12-22 | Disposition: A | Discharge status: Home / Self Care | Payer: Managed Care, Other (non HMO) | Attending: Emergency Medicine | Admitting: Emergency Medicine

## 2021-12-22 ENCOUNTER — Emergency Department: Payer: Managed Care, Other (non HMO)

## 2021-12-22 ENCOUNTER — Other Ambulatory Visit: Payer: Self-pay

## 2021-12-22 DIAGNOSIS — R630 Anorexia: Secondary | ICD-10-CM | POA: Diagnosis not present

## 2021-12-22 DIAGNOSIS — R55 Syncope and collapse: Secondary | ICD-10-CM | POA: Diagnosis not present

## 2021-12-22 DIAGNOSIS — R42 Dizziness and giddiness: Secondary | ICD-10-CM | POA: Insufficient documentation

## 2021-12-22 LAB — BASIC METABOLIC PANEL
Anion gap: 8 (ref 5–15)
BUN: 16 mg/dL (ref 6–20)
CO2: 25 mmol/L (ref 22–32)
Calcium: 9.3 mg/dL (ref 8.9–10.3)
Chloride: 108 mmol/L (ref 98–111)
Creatinine, Ser: 0.89 mg/dL (ref 0.44–1.00)
GFR, Estimated: >60 mL/min (ref >60)
Glucose, Bld: 89 mg/dL (ref 70–99)
Potassium: 4.2 mmol/L (ref 3.5–5.1)
Sodium: 141 mmol/L (ref 135–145)

## 2021-12-22 LAB — CBC
HCT: 42 % (ref 36.0–46.0)
Hemoglobin: 13.1 g/dL (ref 12.0–15.0)
MCH: 25.5 pg — ABNORMAL LOW (ref 26.0–34.0)
MCHC: 31.2 g/dL (ref 30.0–36.0)
MCV: 81.7 fL (ref 80.0–100.0)
Platelets: 372 10*3/uL (ref 150–400)
RBC: 5.14 MIL/uL — ABNORMAL HIGH (ref 3.87–5.11)
RDW: 14 % (ref 11.5–15.5)
WBC: 10 10*3/uL (ref 4.0–10.5)
nRBC: 0 % (ref 0.0–0.2)

## 2021-12-22 NOTE — ED Provider Notes (Signed)
[facility] Provider Note    Event Date/Time   First MD Initiated Contact with Patient 12/22/21 1654     (approximate)   History   Chief Complaint: Dizziness   HPI  MARCELLINE TEMKIN is a 36 y.o. female with a past history of depression, just started on sertraline 1 week ago who was in her usual state of health, driving today when she had an episode of lightheadedness which lasted for 5 minutes.  No loss of consciousness.  No pain or palpitations or other acute symptoms before or afterward.  She pulled over, and then within a few minutes felt back to normal.  Denies any headache or blurry vision, no trauma.  She does report poor oral intake due to decreased appetite since starting sertraline.  She started at a 50 mg dose, and decreased to a 25 mg dose 2 days ago due to the side effects.   Physical Exam   Triage Vital Signs: ED Triage Vitals  Enc Vitals Group     BP 12/22/21 1517 (!) 115/94     Pulse Rate 12/22/21 1517 91     Resp 12/22/21 1517 15     Temp 12/22/21 1517 98.1 F (36.7 C)     Temp Source 12/22/21 1517 Oral     SpO2 12/22/21 1517 100 %     Weight 12/22/21 1514 196 lb (88.9 kg)     Height 12/22/21 1514 5\' 3"  (1.6 m)     Head Circumference --      Peak Flow --      Pain Score 12/22/21 1514 0     Pain Loc --      Pain Edu? --      Excl. in GC? --     Most recent vital signs: Vitals:   12/22/21 1517  BP: (!) 115/94  Pulse: 91  Resp: 15  Temp: 98.1 F (36.7 C)  SpO2: 100%    General: Awake, no distress.  CV:  Good peripheral perfusion.  Regular rate and rhythm.  No murmurs Resp:  Normal effort.  Good auscultation bilaterally Abd:  No distention.  Soft nontender Other:  PERRL, EOMI, no nystagmus.  No signs of trauma.  Moist mucosa   ED Results / Procedures / Treatments   Labs (all labs ordered are listed, but only abnormal results are displayed) Labs Reviewed  CBC - Abnormal; Notable for the following components:       Result Value   RBC 5.14 (*)    MCH 25.5 (*)    All other components within normal limits  BASIC METABOLIC PANEL  URINALYSIS, ROUTINE W REFLEX MICROSCOPIC  CBG MONITORING, ED  POC URINE PREG, ED     EKG Interpreted by me Sinus rhythm rate of 93.  Normal axis, normal intervals.  Normal QRS ST segments and T waves.  No evidence of underlying dysrhythmia.  There is respiratory phasicity.   RADIOLOGY CT head interpreted by me, appears normal, negative for ICH or mass.  Radiology report reviewed.   PROCEDURES:  Procedures   MEDICATIONS ORDERED IN ED: Medications - No data to display   IMPRESSION / MDM / ASSESSMENT AND PLAN / ED COURSE  I reviewed the triage vital signs and the nursing notes.                              Differential diagnosis includes, but is not limited to, dehydration, vagal episode, heat  related vasodilation sertraline withdrawal effects, intracranial mass, anemia, AKI, electrolyte abnormality  Patient's presentation is most consistent with acute presentation with potential threat to life or bodily function.  Patient presents with near syncope, limited episode lasted 5 minutes and resolving spontaneously.  No other red flag symptoms.  She is back to normal and asymptomatic currently.  Reassuring exam and vital signs.  Serum labs, EKG, CT head all unremarkable in the ED.  Stable for discharge, suspect this is related to her decreased appetite recently.       FINAL CLINICAL IMPRESSION(S) / ED DIAGNOSES   Final diagnoses:  Near syncope     Rx / DC Orders   ED Discharge Orders     None        Note:  This document was prepared using Dragon voice recognition software and may include unintentional dictation errors.   Sharman Cheek, MD 12/22/21 1744

## 2021-12-22 NOTE — ED Notes (Signed)
Unable to get bloodwork, will ask other staff to look. PA now at bedside in triage room.

## 2021-12-22 NOTE — ED Triage Notes (Signed)
Pt to ED for episode of dizziness that occurred about 1.5 hour ago while driving and lasted about 5 minutes. Pt pulled over, rested, and had daughter finish the driving. Denies vision changes, weakness, vomiting, CP and SOB. Ambulatory to triage room with steady gait.

## 2021-12-22 NOTE — Discharge Instructions (Signed)
Your lab tests, EKG, and CT scan of the head were all okay today.  Please follow up with your doctor to continue monitoring your symptoms.

## 2021-12-22 NOTE — ED Provider Triage Note (Signed)
Emergency Medicine Provider Triage Evaluation Note  Stephanie Christensen , a 36 y.o. female  was evaluated in triage.  Pt complains of sudden onset of dizziness about 1 hour ago.  Patient states she was driving and suddenly became very dizzy.  It is not positional.  Patient also had her Zoloft reduced from 50 mg to 25 mg on Wednesday.  Unsure if she is dehydrated.  No headache.  No chest pain or shortness of breath..  Review of Systems  Positive:  Negative:   Physical Exam  BP (!) 115/94   Pulse 91   Temp 98.1 F (36.7 C) (Oral)   Resp 15   Ht 5\' 3"  (1.6 m)   Wt 88.9 kg   LMP 12/19/2021 (Approximate)   SpO2 100%   BMI 34.72 kg/m  Gen:   Awake, no distress   Resp:  Normal effort  MSK:   Moves extremities without difficulty  Other:    Medical Decision Making  Medically screening exam initiated at 3:31 PM.  Appropriate orders placed.  COURNEY GARROD was informed that the remainder of the evaluation will be completed by another provider, this initial triage assessment does not replace that evaluation, and the importance of remaining in the ED until their evaluation is complete.  Due to sudden onset of CT of the head   Bubba Hales, PA-C 12/22/21 1531

## 2022-05-29 ENCOUNTER — Other Ambulatory Visit: Payer: Self-pay

## 2022-05-29 ENCOUNTER — Emergency Department
Admission: EM | Admit: 2022-05-29 | Discharge: 2022-05-29 | Disposition: A | Discharge status: Home / Self Care | Payer: Managed Care, Other (non HMO) | Attending: Emergency Medicine | Admitting: Emergency Medicine

## 2022-05-29 DIAGNOSIS — J101 Influenza due to other identified influenza virus with other respiratory manifestations: Secondary | ICD-10-CM | POA: Diagnosis not present

## 2022-05-29 DIAGNOSIS — R059 Cough, unspecified: Secondary | ICD-10-CM | POA: Diagnosis present

## 2022-05-29 DIAGNOSIS — Z1152 Encounter for screening for COVID-19: Secondary | ICD-10-CM | POA: Insufficient documentation

## 2022-05-29 LAB — RESP PANEL BY RT-PCR (RSV, FLU A&B, COVID)  RVPGX2
Influenza A by PCR: NEGATIVE
Influenza B by PCR: POSITIVE — AB
Resp Syncytial Virus by PCR: NEGATIVE
SARS Coronavirus 2 by RT PCR: NEGATIVE

## 2022-05-29 MED ORDER — PSEUDOEPH-BROMPHEN-DM 30-2-10 MG/5ML PO SYRP: 10.0000 mL | ORAL_SOLUTION | Freq: Four times a day (QID) | ORAL | 0 refills | Status: DC | PRN

## 2022-05-29 NOTE — ED Provider Triage Note (Signed)
Emergency Medicine Provider Triage Evaluation Note  Stephanie Christensen , a 37 y.o. female  was evaluated in triage.  Pt complains of cough, body aches, pain behind her eyes that started yesterday. Multiple people sick in her office.  Physical Exam  There were no vitals taken for this visit. Gen:   Awake, no distress   Resp:  Normal effort  MSK:   Moves extremities without difficulty  Other:    Medical Decision Making  Medically screening exam initiated at 6:13 PM.  Appropriate orders placed.  Stephanie Christensen was informed that the remainder of the evaluation will be completed by another provider, this initial triage assessment does not replace that evaluation, and the importance of remaining in the ED until their evaluation is complete.   Victorino Dike, FNP 05/29/22 1818

## 2022-05-29 NOTE — ED Triage Notes (Signed)
Pt states that everyone in her office has been coughing, states that she started with body aches, cough and eye pain yesterday

## 2022-05-29 NOTE — ED Provider Notes (Signed)
[facility] Provider Note    Event Date/Time   First MD Initiated Contact with Patient 05/29/22 1926     (approximate)   History   bodyaches and Cough   HPI  Stephanie Christensen is a 37 y.o. female with no significant past medical history presents to the emergency department for treatment and evaluation of cough, body aches, and pain behind her eyes.  Symptoms started yesterday.  Multiple people in her office have similar symptoms.      Physical Exam   Triage Vital Signs: ED Triage Vitals  Enc Vitals Group     BP 05/29/22 1815 (!) 132/102     Pulse Rate 05/29/22 1815 (!) 112     Resp 05/29/22 1815 16     Temp 05/29/22 1815 98.9 F (37.2 C)     Temp Source 05/29/22 1815 Oral     SpO2 05/29/22 1815 100 %     Weight 05/29/22 1816 184 lb (83.5 kg)     Height 05/29/22 1816 5\' 3"  (1.6 m)     Head Circumference --      Peak Flow --      Pain Score 05/29/22 1816 10     Pain Loc --      Pain Edu? --      Excl. in GC? --     Most recent vital signs: Vitals:   05/29/22 1815  BP: (!) 132/102  Pulse: (!) 112  Resp: 16  Temp: 98.9 F (37.2 C)  SpO2: 100%     General: Awake, no distress.  CV:  Good peripheral perfusion.  Resp:  Normal effort.  Abd:  No distention.  Other:     ED Results / Procedures / Treatments   Labs (all labs ordered are listed, but only abnormal results are displayed) Labs Reviewed  RESP PANEL BY RT-PCR (RSV, FLU A&B, COVID)  RVPGX2 - Abnormal; Notable for the following components:      Result Value   Influenza B by PCR POSITIVE (*)    All other components within normal limits     EKG     RADIOLOGY  Not indicated   PROCEDURES:  Critical Care performed: No  Procedures   MEDICATIONS ORDERED IN ED: Medications - No data to display   IMPRESSION / MDM / ASSESSMENT AND PLAN / ED COURSE  I reviewed the triage vital signs and the nursing notes.                              Differential diagnosis  includes, but is not limited to, COVID, influenza, RSV, acute viral syndrome.  Patient's presentation is most consistent with acute illness / injury with system symptoms.  37 year old female presenting to the emergency department for treatment and evaluation of cough, body aches, and pain behind her eyes that started yesterday.  See HPI for further details.  Influenza B test is positive.  Plan will be to treat her with Bromfed for the cough and have her take Tylenol ibuprofen for body aches or fever.  She is to follow-up with her primary care provider if not improving over the next few days.  If symptoms change or worsen she is unable to schedule appointment she is to return to the emergency department.     FINAL CLINICAL IMPRESSION(S) / ED DIAGNOSES   Final diagnoses:  Influenza B     Rx / DC Orders   ED Discharge Orders  Ordered    brompheniramine-pseudoephedrine-DM 30-2-10 MG/5ML syrup  4 times daily PRN        05/29/22 1920             Note:  This document was prepared using Dragon voice recognition software and may include unintentional dictation errors.   Victorino Dike, FNP 05/29/22 1956    Naaman Plummer, MD 05/29/22 [phone]

## 2022-09-08 ENCOUNTER — Encounter: Payer: Self-pay | Admitting: Emergency Medicine

## 2022-09-08 ENCOUNTER — Other Ambulatory Visit: Payer: Self-pay

## 2022-09-08 ENCOUNTER — Emergency Department
Admission: EM | Admit: 2022-09-08 | Discharge: 2022-09-08 | Disposition: A | Discharge status: Home / Self Care | Payer: Managed Care, Other (non HMO) | Attending: Family Medicine | Admitting: Family Medicine

## 2022-09-08 DIAGNOSIS — K642 Third degree hemorrhoids: Secondary | ICD-10-CM | POA: Insufficient documentation

## 2022-09-08 DIAGNOSIS — K649 Unspecified hemorrhoids: Secondary | ICD-10-CM | POA: Diagnosis present

## 2022-09-08 MED ORDER — HYDROCORTISONE (PERIANAL) 2.5 % EX CREA: 1.0000 | TOPICAL_CREAM | Freq: Two times a day (BID) | CUTANEOUS | 0 refills | Status: DC

## 2022-09-08 NOTE — ED Provider Notes (Signed)
   [facility] Provider Note    None    (approximate)   History   Hemorrhoids   HPI  Stephanie Christensen is a 37 y.o. female with no significant past medical history and as listed in EMR presents to the emergency department for treatment and evaluation of hemorrhoids. She has had issues for several years. She is able to push them back in, but they are really painful now. No relief with OTC medications..      Physical Exam   Triage Vital Signs: ED Triage Vitals  Enc Vitals Group     BP 09/08/22 1844 (!) 159/101     Pulse Rate 09/08/22 1844 (!) 104     Resp 09/08/22 1844 20     Temp 09/08/22 1844 98.3 F (36.8 C)     Temp src --      SpO2 09/08/22 1844 100 %     Weight 09/08/22 1843 189 lb (85.7 kg)     Height 09/08/22 1842  (1.6 m)     Head Circumference --      Peak Flow --      Pain Score 09/08/22 1842 10     Pain Loc --      Pain Edu? --      Excl. in GC? --     Most recent vital signs: Vitals:   09/08/22 1844 09/08/22 2128  BP: (!) 159/101   Pulse: (!) 104 88  Resp: 20 18  Temp: 98.3 F (36.8 C)   SpO2: 100% 99%    General: Awake, no distress.  CV:  Good peripheral perfusion.  Resp:  Normal effort.  Abd:  No distention.  Other:  Single, not thrombosed,  grade 3 hemorrhoid.    ED Results / Procedures / Treatments   Labs (all labs ordered are listed, but only abnormal results are displayed) Labs Reviewed - No data to display   EKG  Not indicated.   RADIOLOGY  Not indicated.  PROCEDURES:  Critical Care performed: No  Procedures   MEDICATIONS ORDERED IN ED:  Medications - No data to display   IMPRESSION / MDM / ASSESSMENT AND PLAN / ED COURSE   I have reviewed the triage note.  Differential diagnosis includes, but is not limited to, hemorrhoid, thrombosed hemorrhoid, anal fissure  Patient's presentation is most consistent with acute illness / injury with system symptoms.  37 year old female  presenting to the emergency department for treatment and evaluation of hemorrhoids.  See HPI for further details.  On exam, hemorrhoid is not thrombosed.  Plan will be to give her a prescription for Anusol and refer her to general surgery.      FINAL CLINICAL IMPRESSION(S) / ED DIAGNOSES   Final diagnoses:  Grade III hemorrhoids     Rx / DC Orders   ED Discharge Orders          Ordered    hydrocortisone (PROCTO-MED HC) 2.5 % rectal cream  2 times daily        09/08/22 2110             Note:  This document was prepared using Dragon voice recognition software and may include unintentional dictation errors.   Chinita Pester, FNP 09/09/22 1903    Pilar Jarvis, MD 09/10/22 [phone]

## 2022-09-08 NOTE — ED Triage Notes (Signed)
Pt via POV from home. Pt c/o hemorrhoid pain and bright red blood when she wipes, pt has a hx of hemorrhoids. Pt states she tried to push them back in. Pt has tried OTC medications. Pt is A&OX4 and NAD

## 2022-09-08 NOTE — ED Notes (Signed)
ED Provider at bedside. 

## 2022-09-17 ENCOUNTER — Encounter: Payer: Self-pay | Admitting: Surgery

## 2022-09-17 ENCOUNTER — Ambulatory Visit (INDEPENDENT_AMBULATORY_CARE_PROVIDER_SITE_OTHER): Payer: Managed Care, Other (non HMO) | Admitting: Surgery

## 2022-09-17 VITALS — BP 167/91 | HR 120 | Temp 98.9°F | Ht 63.0 in | Wt 187.0 lb

## 2022-09-17 DIAGNOSIS — K602 Anal fissure, unspecified: Secondary | ICD-10-CM | POA: Diagnosis not present

## 2022-09-17 NOTE — Progress Notes (Signed)
09/17/2022  Reason for Visit: Inflamed External hemorrhoid  History of Present Illness: Stephanie Christensen is a 37 y.o. female presenting for evaluation of an inflamed external hemorrhoid.  Patient reported she has been having issues with hemorrhoids for many years since she was about 37 years old.  She has had times where the hemorrhoids have been more inflamed than others.  Sometimes she has had issues with constipation.  She presented to the emergency room on 09/08/2022 reporting that more recently she has been having issues with worsening pain in the perianal region associated with some bleeding as well.  The patient reports that the pain is worse with bowel movements and this pain last for a few hours.  Reports bleeding with the bowel movement as well but not in between.  With her constipation issues, reports that sometimes she has a bowel movement every other day but reports also that sometimes she is just more worried about the pain that she is going to have with the bowel movement.  She has not had any prior procedures for hemorrhoids.  In the emergency room, she was given hydrocortisone ointment prescription and referred to [place] for further evaluation.  Past Medical History: History reviewed. No pertinent past medical history.   Past Surgical History: Past Surgical History:  Procedure Laterality Date   ECTOPIC PREGNANCY SURGERY      Home Medications: Prior to Admission medications   Medication Sig Start Date End Date Taking? Authorizing Provider  hydrocortisone (PROCTO-MED HC) 2.5 % rectal cream Place 1 Application rectally 2 (two) times daily. 09/08/22  Yes Triplett, Cari B, FNP    Allergies: No Known Allergies  Social History:  reports that she has never smoked. She has never used smokeless tobacco. She reports current alcohol use. She reports that she does not use drugs.   Family History: History reviewed. No pertinent family history.  Review of Systems: Review of Systems   Constitutional:  Negative for chills and fever.  Respiratory:  Negative for shortness of breath.   Cardiovascular:  Negative for chest pain.  Gastrointestinal:  Positive for blood in stool and constipation. Negative for abdominal pain, nausea and vomiting.    Physical Exam BP (!) 167/91   Pulse (!) 120   Temp 98.9 F (37.2 C) (Oral)   Ht 5\' 3"  (1.6 m)   Wt 187 lb (84.8 kg)   SpO2 100%   BMI 33.13 kg/m  CONSTITUTIONAL: No acute distress HEENT:  Normocephalic, atraumatic, extraocular motion intact.  RESPIRATORY:  Normal respiratory effort without pathologic use of accessory muscles. CARDIOVASCULAR: Regular rhythm and rate. RECTAL:  External exam reveals minimally enlarged external hemorrhoids of anterior and left lateral aspects.  These are not inflamed.  She does have a posterior anal fissure which is tender to palpation.  Digital rectal exam was attempted but she was too tender to complete.  Internal hemorrhoids were not visible. MUSCULOSKELETAL:  Normal muscle strength and tone in all four extremities.  No peripheral edema or cyanosis. NEUROLOGIC:  Motor and sensation is grossly normal.  Cranial nerves are grossly intact. PSYCH:  Alert and oriented to person, place and time. Affect is normal.  Laboratory Analysis: No results found for this or any previous visit (from the past 24 hour(s)).  Imaging: No results found.  Assessment and Plan: This is a 37 y.o. female with a posterior anal fissure.  - Discussed with the patient the findings on exam.  Her issue is not hemorrhoid problems but actually a posterior anal fissure.  She likely developed this during an episode of worse constipation.  Discussed with her how the inflammation from the tear and the anal canal lining causes inflammation and spasms of the sphincter muscle which then does not allow the wound to heal and create a cycle which continues to worsen.  We have to break the cycle and discussed with her that the first-line of  treatment is using nifedipine ointment.  This will help relax the sphincter muscle to allow for the tear to start healing by improving the blood flow to the.  Discussed with her that we need to also work with constipation issues and discussed using fiber supplementation as well as daily MiraLAX to help with her bowel movements.  She may do sitz bath's well to help soothe the tissue. - Patient will follow-up with me in a month to reevaluate her progress.  I spent 30 minutes dedicated to the care of this patient on the date of this encounter to include pre-visit review of records, face-to-face time with the patient discussing diagnosis and management, and any post-visit coordination of care.   Howie Ill, MD [facility]

## 2022-09-17 NOTE — Patient Instructions (Addendum)
Please pick up your prescription at [facility] in [place].                                                              [address], [place], [place] [address]                                                              [phone]   Advised to pursue a goal of 25 to 30 g of fiber daily.  Made aware that the majority of this may be through natural sources, but advised to be aware of actual consumption and to ensure minimal consumption by daily supplementation.  Various forms of supplements discussed.  Recommended Psyllium husk, that mixes well with applesauce, or the powder which goes down well shaken with chocolate milk.  Strongly advised to consume more fluids to ensure adequate hydration, instructed to watch color of urine to determine adequacy of hydration.  Clarity is pursued in urine output, and bowel activity that correlates to significant meal intake.   We need to avoid deferring having bowel movements, advised to take the time at the first sign of sensation, typically following meals, and in the morning.   Subsequent utilization of MiraLAX may be needed ensure at least daily movement, ideally twice daily bowel movements.  If multiple doses of MiraLAX are necessary utilize them. Never skip a day...  To be regular, we must do the above EVERY day.    If you have any concerns or questions, please feel free to call our office.   Anal Fissure, Adult   An anal fissure is a small tear or crack in the tissue around the opening of the butt (anus). Bleeding from the tear or crack usually stops on its own within a few minutes. The bleeding may happen every time you poop (have a bowel movement) until the tear or crack heals. What are the causes? This condition is usually caused by passing a large or hard poop (stool). Other causes include: Trouble pooping (constipation). Passing watery poop (diarrhea). Inflammatory bowel disease (Crohn's disease or ulcerative  colitis). Childbirth. Infections. Anal sex. What are the signs or symptoms? Symptoms of this condition include: Bleeding from the butt. Small amounts of blood on your poop. The blood coats the outside of the poop. It is not mixed with the poop. Small amounts of blood on the toilet paper or in the toilet after you poop. Pain when passing poop. Itching or irritation around the opening of the butt. How is this diagnosed? This condition may be diagnosed based on a physical exam. Your doctor may: Check your butt. A tear can often be seen by checking the area with care. Check your butt using a short tube (anoscope). The light in the tube will show any problems in your butt. How is this treated? Treatment for this condition may include: Treating problems that make it hard for you to pass poop. You may be told to: Eat more fiber. Drink more fluid. Take fiber supplements. Take medicines that make poop soft. Taking warm water baths (  sitz baths). This may help to heal the tear. Using creams and ointments. If your condition gets worse, other treatments may be needed such as: A shot near the tear or crack (botulinum injection). Surgery to repair the tear or crack. Follow these instructions at home: Eating and drinking  Avoid bananas and dairy products. These foods can make it hard to poop. Drink enough fluid to keep your pee (urine) pale yellow. Eat foods that have a lot of fiber in them, such as: Beans. Whole grains. Fresh fruits. Fresh vegetables. General instructions  Take over-the-counter and prescription medicines only as told by your doctor. Use creams or ointments only as told by your doctor. Keep the butt area as clean and dry as you can. Take a warm water bath as told by your doctor. Do not use soap. Keep all follow-up visits as told by your doctor. This is important. Contact a doctor if: You have more bleeding. You have a fever. You have watery poop that is mixed with  blood. You have pain. Your problem gets worse, not better. Summary An anal fissure is a small tear or crack in the skin around the opening of the butt (anus). This condition is usually caused by passing a large or hard poop (stool). Treatment includes treating the problems that make it hard for you to pass poop. Follow your doctor's instructions about caring for your condition at home. Keep all follow-up visits as told by your doctor. This is important. This information is not intended to replace advice given to you by your health care provider. Make sure you discuss any questions you have with your health care provider. Document Revised: 12/01/2020 Document Reviewed: 12/01/2020 Elsevier Patient Education  2023 [employer].   Constipation, Adult Constipation is when a person has trouble pooping (having a bowel movement). When you have this condition, you may poop fewer than 3 times a week. Your poop (stool) may also be dry, hard, or bigger than normal. Follow these instructions at home: Eating and drinking  Eat foods that have a lot of fiber, such as: Fresh fruits and vegetables. Whole grains. Beans. Eat less of foods that are low in fiber and high in fat and sugar, such as: [place] fries. Hamburgers. Cookies. Candy. Soda. Drink enough fluid to keep your pee (urine) pale yellow. General instructions Exercise regularly or as told by your doctor. Try to do 150 minutes of exercise each week. Go to the restroom when you feel like you need to poop. Do not hold it in. Take over-the-counter and prescription medicines only as told by your doctor. These include any fiber supplements. When you poop: Do deep breathing while relaxing your lower belly (abdomen). Relax your pelvic floor. The pelvic floor is a group of muscles that support the rectum, bladder, and intestines (as well as the uterus in women). Watch your condition for any changes. Tell your doctor if you notice any. Keep all  follow-up visits as told by your doctor. This is important. Contact a doctor if: You have pain that gets worse. You have a fever. You have not pooped for 4 days. You vomit. You are not hungry. You lose weight. You are bleeding from the opening of the butt (anus). You have thin, pencil-like poop. Get help right away if: You have a fever, and your symptoms suddenly get worse. You leak poop or have blood in your poop. Your belly feels hard or bigger than normal (bloated). You have very bad belly pain. You feel dizzy  or you faint. Summary Constipation is when a person poops fewer than 3 times a week, has trouble pooping, or has poop that is dry, hard, or bigger than normal. Eat foods that have a lot of fiber. Drink enough fluid to keep your pee (urine) pale yellow. Take over-the-counter and prescription medicines only as told by your doctor. These include any fiber supplements. This information is not intended to replace advice given to you by your health care provider. Make sure you discuss any questions you have with your health care provider. Document Revised: 03/21/2022 Document Reviewed: 03/21/2022 Elsevier Patient Education  2023 [employer].

## 2022-10-17 ENCOUNTER — Encounter: Payer: Self-pay | Admitting: Surgery

## 2022-10-17 ENCOUNTER — Ambulatory Visit (INDEPENDENT_AMBULATORY_CARE_PROVIDER_SITE_OTHER): Payer: Managed Care, Other (non HMO) | Admitting: Surgery

## 2022-10-17 VITALS — BP 128/90 | HR 86 | Temp 98.0°F | Ht 63.0 in | Wt 188.0 lb

## 2022-10-17 DIAGNOSIS — K602 Anal fissure, unspecified: Secondary | ICD-10-CM

## 2022-10-17 NOTE — Patient Instructions (Addendum)
The area will continue to improve. Be sure to use the Nifedipine cream for the full 6 weeks.  Be sure to keep yourself regular with daily bowel movements to prevent this in the future.  If you have a recurrence, restart the Nifedipine but call us for a check up for this.     Follow-up with our office as needed.  Please call and ask to speak with a nurse if you develop questions or concerns.

## 2022-10-17 NOTE — Progress Notes (Signed)
  10/17/2022  History of Present Illness: Stephanie Christensen is a 37 y.o. female presenting for follow up of a posterior ana fissure.  She was last seen on 09/17/22 and was started on nifedipine.  She reports that this has helped a lot and her symptoms are almost resolved.  She reports she's been drinking more water and has a bowel movement every other day if not daily.  Denies any perianal pain with bowel movement.  Past Medical History: No past medical history on file.   Past Surgical History: Past Surgical History:  Procedure Laterality Date   ECTOPIC PREGNANCY SURGERY      Home Medications: Prior to Admission medications   Medication Sig Start Date End Date Taking? Authorizing Provider  citalopram (CELEXA) 20 MG tablet Take 20 mg by mouth as needed. 06/27/22  Yes [provider]  hydrOXYzine (ATARAX) 25 MG tablet Take 25 mg by mouth as needed. 06/27/22  Yes [provider]  nifedipine 0.3 % ointment Place 1 Application rectally 4 (four) times daily.   Yes [provider]    Allergies: No Known Allergies  Review of Systems: Review of Systems  Constitutional:  Negative for chills and fever.  Respiratory:  Negative for shortness of breath.   Cardiovascular:  Negative for chest pain.  Gastrointestinal:  Negative for blood in stool, constipation, nausea and vomiting.    Physical Exam BP (!) 128/90   Pulse 86   Temp 98 F (36.7 C)   Ht 5\' 3"  (1.6 m)   Wt 188 lb (85.3 kg)   LMP 09/30/2022   SpO2 98%   BMI 33.30 kg/m  CONSTITUTIONAL: No acute distress, well nourished. HEENT:  Normocephalic, atraumatic, extraocular motion intact. RESPIRATORY:  Normal respiratory effort without pathologic use of accessory muscles. CARDIOVASCULAR: Regular rhythm and rate. RECTAL:  External exam reveals a healed anal fissure, with very small residual tag.  No open wound visible.  Digital rectal exam shows improved rectal tone, with only some discomfort compared to prior  exam. NEUROLOGIC:  Motor and sensation is grossly normal.  Cranial nerves are grossly intact. PSYCH:  Alert and oriented to person, place and time. Affect is normal.   Assessment and Plan: This is a 37 y.o. female with a healing posterior anal fissure.  --The patient is doing very well after starting nifedipine and working on her constipation.  The fissure is healed, or at least there is no noticeable open wound on exam.  Recommended that she continue Nifedipine to complete 6 weeks and then can stop.  Continue working on constipation, as this would be the biggest risk factor for recurrence. --Follow up as needed.  I spent 20 minutes dedicated to the care of this patient on the date of this encounter to include pre-visit review of records, face-to-face time with the patient discussing diagnosis and management, and any post-visit coordination of care.   Howie Ill, MD [facility]

## 2023-03-11 ENCOUNTER — Emergency Department
Admission: EM | Admit: 2023-03-11 | Discharge: 2023-03-11 | Discharge status: Against Medical Advice | Payer: Managed Care, Other (non HMO) | Attending: Emergency Medicine | Admitting: Emergency Medicine

## 2023-03-11 ENCOUNTER — Other Ambulatory Visit: Payer: Self-pay

## 2023-03-11 DIAGNOSIS — Z5321 Procedure and treatment not carried out due to patient leaving prior to being seen by health care provider: Secondary | ICD-10-CM | POA: Diagnosis not present

## 2023-03-11 DIAGNOSIS — R1013 Epigastric pain: Secondary | ICD-10-CM | POA: Insufficient documentation

## 2023-03-11 LAB — COMPREHENSIVE METABOLIC PANEL
ALT: 15 U/L (ref 0–44)
AST: 15 U/L (ref 15–41)
Albumin: 4 g/dL (ref 3.5–5.0)
Alkaline Phosphatase: 95 U/L (ref 38–126)
Anion gap: 9 (ref 5–15)
BUN: 13 mg/dL (ref 6–20)
CO2: 23 mmol/L (ref 22–32)
Calcium: 8.9 mg/dL (ref 8.9–10.3)
Chloride: 107 mmol/L (ref 98–111)
Creatinine, Ser: 0.79 mg/dL (ref 0.44–1.00)
GFR, Estimated: >60 mL/min (ref >60)
Glucose, Bld: 135 mg/dL — ABNORMAL HIGH (ref 70–99)
Potassium: 3.5 mmol/L (ref 3.5–5.1)
Sodium: 139 mmol/L (ref 135–145)
Total Bilirubin: 0.4 mg/dL (ref 0.3–1.2)
Total Protein: 8 g/dL (ref 6.5–8.1)

## 2023-03-11 LAB — CBC
HCT: 39.6 % (ref 36.0–46.0)
Hemoglobin: 12.7 g/dL (ref 12.0–15.0)
MCH: 26.2 pg (ref 26.0–34.0)
MCHC: 32.1 g/dL (ref 30.0–36.0)
MCV: 81.8 fL (ref 80.0–100.0)
Platelets: 386 10*3/uL (ref 150–400)
RBC: 4.84 MIL/uL (ref 3.87–5.11)
RDW: 14 % (ref 11.5–15.5)
WBC: 10.9 10*3/uL — ABNORMAL HIGH (ref 4.0–10.5)
nRBC: 0 % (ref 0.0–0.2)

## 2023-03-11 LAB — LIPASE, BLOOD: Lipase: 44 U/L (ref 11–51)

## 2023-03-11 NOTE — ED Triage Notes (Signed)
Pt presents to ER with c/o epigastric area abd pain that radiates around to right side of back that started 10/18.  Pt states she sometimes feels nauseated when the pain comes, and that taking a deep breath will make pain worse.  Pain is intermittent in nature.  Denies vomiting or diarrhea.  Pt otherwise A&O x4 and in NAD.

## 2023-08-03 ENCOUNTER — Other Ambulatory Visit: Payer: Self-pay

## 2023-08-03 ENCOUNTER — Emergency Department
Admission: EM | Admit: 2023-08-03 | Discharge: 2023-08-03 | Disposition: A | Discharge status: Home / Self Care | Attending: Emergency Medicine | Admitting: Emergency Medicine

## 2023-08-03 DIAGNOSIS — R509 Fever, unspecified: Secondary | ICD-10-CM | POA: Diagnosis present

## 2023-08-03 DIAGNOSIS — U071 COVID-19: Secondary | ICD-10-CM | POA: Insufficient documentation

## 2023-08-03 LAB — RESP PANEL BY RT-PCR (RSV, FLU A&B, COVID)  RVPGX2
Influenza A by PCR: NEGATIVE
Influenza B by PCR: NEGATIVE
Resp Syncytial Virus by PCR: NEGATIVE
SARS Coronavirus 2 by RT PCR: POSITIVE — AB

## 2023-08-03 MED ORDER — GUAIFENESIN-CODEINE 100-10 MG/5ML PO SOLN: 10.0000 mL | Freq: Three times a day (TID) | ORAL | 0 refills | Status: AC | PRN

## 2023-08-03 MED ORDER — NAPROXEN 500 MG PO TABS: 500.0000 mg | ORAL_TABLET | Freq: Two times a day (BID) | ORAL | 0 refills | Status: AC

## 2023-08-03 NOTE — ED Triage Notes (Addendum)
 Pt c/o body aches, headache, cough, and fever at home, states someone at work had covid. Pt AOX4, Nad noted, cough noted. Pt denies N/V/D.  Pt took cold medicine today.

## 2023-08-03 NOTE — ED Provider Notes (Signed)
 [facility] Provider Note    Event Date/Time   First MD Initiated Contact with Patient 08/03/23 1539     (approximate)   History   viral symptoms   HPI  Stephanie Christensen is a 38 y.o. female  with no significant past medical history and as listed in EMR presents to the emergency department for treatment and evaluation of body aches, headache cough, and fever. Coworker diagnosed with Covid.       Physical Exam   Triage Vital Signs: ED Triage Vitals  Encounter Vitals Group     BP 08/03/23 1522 (!) 135/99     Systolic BP Percentile --      Diastolic BP Percentile --      Pulse Rate 08/03/23 1522 (!) 126     Resp 08/03/23 1522 18     Temp 08/03/23 1522 98.7 F (37.1 C)     Temp Source 08/03/23 1522 Oral     SpO2 08/03/23 1522 99 %     Weight --      Height --      Head Circumference --      Peak Flow --      Pain Score 08/03/23 1523 8     Pain Loc --      Pain Education --      Exclude from Growth Chart --     Most recent vital signs: Vitals:   08/03/23 1522  BP: (!) 135/99  Pulse: (!) 126  Resp: 18  Temp: 98.7 F (37.1 C)  SpO2: 99%    General: Awake, no distress.  CV:  Good peripheral perfusion.  Resp:  Normal effort. Breath sounds clear to auscultation Abd:  No distention. Soft. Other:     ED Results / Procedures / Treatments   Labs (all labs ordered are listed, but only abnormal results are displayed) Labs Reviewed  RESP PANEL BY RT-PCR (RSV, FLU A&B, COVID)  RVPGX2 - Abnormal; Notable for the following components:      Result Value   SARS Coronavirus 2 by RT PCR POSITIVE (*)    All other components within normal limits     EKG  Not indicated.   RADIOLOGY  Image and radiology report reviewed and interpreted by me. Radiology report consistent with the same.   Not indicated  PROCEDURES:  Critical Care performed: No  Procedures   MEDICATIONS ORDERED IN ED:  Medications - No data to  display   IMPRESSION / MDM / ASSESSMENT AND PLAN / ED COURSE   I have reviewed the triage note.  Differential diagnosis includes, but is not limited to, COVID, influenza, RSV, viral syndrome  Patient's presentation is most consistent with acute illness / injury with system symptoms.  38 year old female presenting to the emergency department for treatment and evaluation of headache, cough, body aches, and fever.  See HPI for further details.  COVID test is positive.  Results were discussed with the patient.  Prescriptions for Robitussin AC and Naprosyn submitted to the patient's pharmacy.  She was also advised to take Tylenol every 6 hours.  If not improving over the week, she is to follow-up with primary care.  ER return precautions advised as well.      FINAL CLINICAL IMPRESSION(S) / ED DIAGNOSES   Final diagnoses:  COVID     Rx / DC Orders   ED Discharge Orders          Ordered    guaiFENesin-codeine 100-10 MG/5ML syrup  3  times daily PRN        08/03/23 1638    naproxen (NAPROSYN) 500 MG tablet  2 times daily with meals        08/03/23 1638             Note:  This document was prepared using Dragon voice recognition software and may include unintentional dictation errors.   Chinita Pester, FNP 08/04/23 2357    Delton [place], MD 08/05/23 2255

## 2023-09-17 ENCOUNTER — Other Ambulatory Visit: Payer: Self-pay

## 2023-09-17 ENCOUNTER — Encounter: Payer: Self-pay | Admitting: Emergency Medicine

## 2023-09-17 DIAGNOSIS — R1031 Right lower quadrant pain: Secondary | ICD-10-CM | POA: Diagnosis present

## 2023-09-17 DIAGNOSIS — R110 Nausea: Secondary | ICD-10-CM | POA: Diagnosis not present

## 2023-09-17 LAB — CBC
HCT: 40 % (ref 36.0–46.0)
Hemoglobin: 12.7 g/dL (ref 12.0–15.0)
MCH: 26.2 pg (ref 26.0–34.0)
MCHC: 31.8 g/dL (ref 30.0–36.0)
MCV: 82.6 fL (ref 80.0–100.0)
Platelets: 377 10*3/uL (ref 150–400)
RBC: 4.84 MIL/uL (ref 3.87–5.11)
RDW: 14.3 % (ref 11.5–15.5)
WBC: 12.7 10*3/uL — ABNORMAL HIGH (ref 4.0–10.5)
nRBC: 0 % (ref 0.0–0.2)

## 2023-09-17 NOTE — ED Triage Notes (Signed)
 Patient ambulatory to triage with steady gait, without difficulty or distress noted; pt reports today having rt flank/side/abd pain accomp by nausea; denies hx of same

## 2023-09-18 ENCOUNTER — Emergency Department

## 2023-09-18 ENCOUNTER — Emergency Department
Admission: EM | Admit: 2023-09-18 | Discharge: 2023-09-18 | Disposition: A | Discharge status: Home / Self Care | Attending: Emergency Medicine | Admitting: Emergency Medicine

## 2023-09-18 DIAGNOSIS — R1031 Right lower quadrant pain: Secondary | ICD-10-CM

## 2023-09-18 LAB — URINALYSIS, ROUTINE W REFLEX MICROSCOPIC
Bilirubin Urine: NEGATIVE
Glucose, UA: NEGATIVE mg/dL
Ketones, ur: 5 mg/dL — AB
Leukocytes,Ua: NEGATIVE
Nitrite: NEGATIVE
Protein, ur: NEGATIVE mg/dL
Specific Gravity, Urine: >1.046 — ABNORMAL HIGH (ref 1.005–1.030)
pH: 6 (ref 5.0–8.0)

## 2023-09-18 LAB — BASIC METABOLIC PANEL WITH GFR
Anion gap: 7 (ref 5–15)
BUN: 17 mg/dL (ref 6–20)
CO2: 21 mmol/L — ABNORMAL LOW (ref 22–32)
Calcium: 8.7 mg/dL — ABNORMAL LOW (ref 8.9–10.3)
Chloride: 108 mmol/L (ref 98–111)
Creatinine, Ser: 0.75 mg/dL (ref 0.44–1.00)
GFR, Estimated: >60 mL/min (ref >60)
Glucose, Bld: 99 mg/dL (ref 70–99)
Potassium: 3.7 mmol/L (ref 3.5–5.1)
Sodium: 136 mmol/L (ref 135–145)

## 2023-09-18 LAB — HEPATIC FUNCTION PANEL
ALT: 16 U/L (ref 0–44)
AST: 18 U/L (ref 15–41)
Albumin: 4 g/dL (ref 3.5–5.0)
Alkaline Phosphatase: 96 U/L (ref 38–126)
Bilirubin, Direct: <0.1 mg/dL (ref 0.0–0.2)
Total Bilirubin: 0.4 mg/dL (ref 0.0–1.2)
Total Protein: 7.8 g/dL (ref 6.5–8.1)

## 2023-09-18 LAB — LIPASE, BLOOD: Lipase: 32 U/L (ref 11–51)

## 2023-09-18 LAB — HCG, QUANTITATIVE, PREGNANCY: hCG, Beta Chain, Quant, S: <1 m[IU]/mL (ref <5)

## 2023-09-18 MED ORDER — IOHEXOL 300 MG/ML  SOLN
100.0000 mL | Freq: Once | INTRAMUSCULAR | Status: AC | PRN
  Administered 2023-09-18: 100 mL via INTRAVENOUS

## 2023-09-18 NOTE — Discharge Instructions (Signed)
 Your workup is reassuring.  You may take Tylenol or ibuprofen as needed for pain.  Follow-up with your primary care provider.  Return to the ER for new, worsening, or persistent severe abdominal or flank pain, vomiting, fever, weakness, or any other new or worsening symptoms that concern you.

## 2023-09-18 NOTE — ED Provider Notes (Signed)
 [facility] Provider Note    Event Date/Time   First MD Initiated Contact with Patient 09/18/23 0126     (approximate)   History   Flank Pain   HPI  ALISSE TWARDZIK is a 38 y.o. female with no significant PMH who presents with right lower quadrant abdominal pain since earlier today, persistent course, somewhat waxing and waning in intensity but constant.  She reports associated nausea but no vomiting.  She denies any diarrhea.  She does not have any hematuria, dysuria, or other urinary symptoms.  She denies any vaginal bleeding or discharge.  I reviewed the past medical records.  The patient's most recent outpatient encounter was a visit with Dr. Mauri Sous from surgery on 5/29 of last year for follow-up of an anal fissure.  She has no recent hospitalizations.   Physical Exam   Triage Vital Signs: ED Triage Vitals  Encounter Vitals Group     BP 09/17/23 2322 (!) 148/102     Systolic BP Percentile --      Diastolic BP Percentile --      Pulse Rate 09/17/23 2322 93     Resp 09/17/23 2322 (!) 22     Temp 09/17/23 2322 98.4 F (36.9 C)     Temp src --      SpO2 09/17/23 2322 93 %     Weight 09/17/23 2321 200 lb (90.7 kg)     Height 09/17/23 2321 5\' 3"  (1.6 m)     Head Circumference --      Peak Flow --      Pain Score 09/17/23 2320 6     Pain Loc --      Pain Education --      Exclude from Growth Chart --     Most recent vital signs: Vitals:   09/17/23 2322  BP: (!) 148/102  Pulse: 93  Resp: (!) 22  Temp: 98.4 F (36.9 C)  SpO2: 93%     General: Alert, comfortable appearing, no distress.  CV:  Good peripheral perfusion.  Resp:  Normal effort.  Abd:  No distention.  Mild right lower quadrant tenderness. Other:  No jaundice or scleral icterus.   ED Results / Procedures / Treatments   Labs (all labs ordered are listed, but only abnormal results are displayed) Labs Reviewed  URINALYSIS, ROUTINE W REFLEX MICROSCOPIC - Abnormal;  Notable for the following components:      Result Value   Color, Urine STRAW (*)    APPearance HAZY (*)    Specific Gravity, Urine >1.046 (*)    Hgb urine dipstick LARGE (*)    Ketones, ur 5 (*)    Bacteria, UA RARE (*)    All other components within normal limits  CBC - Abnormal; Notable for the following components:   WBC 12.7 (*)    All other components within normal limits  BASIC METABOLIC PANEL WITH GFR - Abnormal; Notable for the following components:   CO2 21 (*)    Calcium 8.7 (*)    All other components within normal limits  HEPATIC FUNCTION PANEL  LIPASE, BLOOD  HCG, QUANTITATIVE, PREGNANCY     EKG     RADIOLOGY  CT abdomen/pelvis: I independently viewed and interpreted the images; there are no dilated bowel loops or any free air or free fluid.  Radiology report indicates the following:   IMPRESSION:  1. No acute findings. Normal appendix.     PROCEDURES:  Critical Care performed: No  Procedures  MEDICATIONS ORDERED IN ED: Medications  iohexol (OMNIPAQUE) 300 MG/ML solution 100 mL (100 mLs Intravenous Contrast Given 09/18/23 0222)     IMPRESSION / MDM / ASSESSMENT AND PLAN / ED COURSE  I reviewed the triage vital signs and the nursing notes.  38 year old female with PMH as noted above presents with right lower quadrant/right flank pain since earlier today with nausea but no other significant associated symptoms.  On exam the patient is overall well-appearing.  Her vital signs are normal except for mild hypertension.  She has some tenderness in the right lower quadrant.  Differential diagnosis includes, but is not limited to, appendicitis, colitis, diverticulitis, mesenteric adenitis, biliary colic, cholecystitis, ureteral stone, ruptured ovarian cyst.  There is no clinical evidence for torsion or other emergent gynecologic etiology.  Initial labs reveal mild leukocytosis of 12.7.  BMP, LFTs, lipase are all within normal limits.  Will obtain a CT for  further evaluation as well as a urinalysis.  Patient's presentation is most consistent with acute complicated illness / injury requiring diagnostic workup.  ----------------------------------------- 5:41 AM on 09/18/2023 -----------------------------------------  CT is negative for acute findings.  Urinalysis shows some hemoglobin and a few RBCs but no evidence of infection.  The patient is comfortable on reassessment.  She has not required any pain medication in the ED.  She is stable for discharge at this time.  I counseled her on the results of the workup.  Return precautions given, and she expresses understanding.   FINAL CLINICAL IMPRESSION(S) / ED DIAGNOSES   Final diagnoses:  Right lower quadrant pain     Rx / DC Orders   ED Discharge Orders     None        Note:  This document was prepared using Dragon voice recognition software and may include unintentional dictation errors.    Lind Repine, MD 09/18/23 [phone]

## 2023-12-27 NOTE — Progress Notes (Signed)
 No show

## 2023-12-28 ENCOUNTER — Emergency Department
Admission: EM | Admit: 2023-12-28 | Discharge: 2023-12-29 | Disposition: A | Discharge status: Home / Self Care | Attending: Emergency Medicine | Admitting: Emergency Medicine

## 2023-12-28 ENCOUNTER — Other Ambulatory Visit: Payer: Self-pay

## 2023-12-28 DIAGNOSIS — M542 Cervicalgia: Secondary | ICD-10-CM | POA: Diagnosis not present

## 2023-12-28 DIAGNOSIS — R42 Dizziness and giddiness: Secondary | ICD-10-CM | POA: Insufficient documentation

## 2023-12-28 DIAGNOSIS — M25512 Pain in left shoulder: Secondary | ICD-10-CM | POA: Insufficient documentation

## 2023-12-28 DIAGNOSIS — F419 Anxiety disorder, unspecified: Secondary | ICD-10-CM | POA: Insufficient documentation

## 2023-12-28 DIAGNOSIS — M25511 Pain in right shoulder: Secondary | ICD-10-CM | POA: Insufficient documentation

## 2023-12-28 LAB — CBC
HCT: 38.4 % (ref 36.0–46.0)
Hemoglobin: 12.2 g/dL (ref 12.0–15.0)
MCH: 26.4 pg (ref 26.0–34.0)
MCHC: 31.8 g/dL (ref 30.0–36.0)
MCV: 83.1 fL (ref 80.0–100.0)
Platelets: 383 K/uL (ref 150–400)
RBC: 4.62 MIL/uL (ref 3.87–5.11)
RDW: 14.3 % (ref 11.5–15.5)
WBC: 11.3 K/uL — ABNORMAL HIGH (ref 4.0–10.5)
nRBC: 0 % (ref 0.0–0.2)

## 2023-12-28 LAB — COMPREHENSIVE METABOLIC PANEL WITH GFR
ALT: 15 U/L (ref 0–44)
AST: 21 U/L (ref 15–41)
Albumin: 3.8 g/dL (ref 3.5–5.0)
Alkaline Phosphatase: 104 U/L (ref 38–126)
Anion gap: 9 (ref 5–15)
BUN: 12 mg/dL (ref 6–20)
CO2: 19 mmol/L — ABNORMAL LOW (ref 22–32)
Calcium: 8.8 mg/dL — ABNORMAL LOW (ref 8.9–10.3)
Chloride: 107 mmol/L (ref 98–111)
Creatinine, Ser: 0.81 mg/dL (ref 0.44–1.00)
GFR, Estimated: >60 mL/min (ref >60)
Glucose, Bld: 106 mg/dL — ABNORMAL HIGH (ref 70–99)
Potassium: 3.8 mmol/L (ref 3.5–5.1)
Sodium: 135 mmol/L (ref 135–145)
Total Bilirubin: 0.4 mg/dL (ref 0.0–1.2)
Total Protein: 7.6 g/dL (ref 6.5–8.1)

## 2023-12-28 LAB — CBG MONITORING, ED: Glucose-Capillary: 106 mg/dL — ABNORMAL HIGH (ref 70–99)

## 2023-12-28 NOTE — ED Triage Notes (Signed)
 Pt to ED via POV c/o episodes of neck pain and dizziness. Pt reports this has been going on for a few months but todays episode was more prolonged than usual. Pt denies dizziness at this time. Denies any known injuries

## 2023-12-29 NOTE — ED Notes (Signed)
 Discharge instructions reviewed.   Opportunity for questions and concerns provided.   Alert, oriented and ambulatory.   Displays no signs of distress.   Encouraged to follow up with PCP as directed.

## 2023-12-29 NOTE — ED Provider Notes (Signed)
 [facility] Provider Note    Event Date/Time   First MD Initiated Contact with Patient 12/28/23 2341     (approximate)   History   Neck Pain and Near Syncope   HPI Stephanie Christensen is a 38 y.o. female who presents for evaluation of some lightheadedness and some pain in the upper part of the middle of her shoulders and the back of her neck.  She said that she has had neck pain on and off for years and it feels similar.  Sometimes it will radiate up into her head but not currently.  She said that she thinks that when she was feeling a bit lightheaded and dizzy, it made her anxiety go through the roof and I started to panic.  She has had no chest pain or shortness of breath.  She feels asymptomatic currently and has felt fine since coming to the emergency department.  No recent fever.  No neck pain or stiffness.  No injury of which she is aware.     Physical Exam   Triage Vital Signs: ED Triage Vitals  Encounter Vitals Group     BP 12/28/23 2133 (!) 150/98     Girls Systolic BP Percentile --      Girls Diastolic BP Percentile --      Boys Systolic BP Percentile --      Boys Diastolic BP Percentile --      Pulse Rate 12/28/23 2133 99     Resp 12/28/23 2133 18     Temp 12/28/23 2133 98.8 F (37.1 C)     Temp Source 12/28/23 2133 Oral     SpO2 12/28/23 2133 100 %     Weight 12/28/23 2144 93 kg (205 lb)     Height 12/28/23 2144 1.6 m (5' 3)     Head Circumference --      Peak Flow --      Pain Score 12/28/23 2144 3     Pain Loc --      Pain Education --      Exclude from Growth Chart --     Most recent vital signs: Vitals:   12/28/23 2133  BP: (!) 150/98  Pulse: 99  Resp: 18  Temp: 98.8 F (37.1 C)  SpO2: 100%    General: Awake, no distress.  Very well-appearing, pleasant mood and affect, alert and oriented and conversant. CV:  Good peripheral perfusion.  Regular rate and rhythm. Resp:  Normal effort. Speaking easily and comfortably, no  accessory muscle usage nor intercostal retractions.   Abd:  No distention.  Other:  No neurological deficits, no facial droop, no dysarthria nor aphasia.  Good bilateral grip strength and major muscle group strength throughout.   ED Results / Procedures / Treatments   Labs (all labs ordered are listed, but only abnormal results are displayed) Labs Reviewed  COMPREHENSIVE METABOLIC PANEL WITH GFR - Abnormal; Notable for the following components:      Result Value   CO2 19 (*)    Glucose, Bld 106 (*)    Calcium 8.8 (*)    All other components within normal limits  CBC - Abnormal; Notable for the following components:   WBC 11.3 (*)    All other components within normal limits  CBG MONITORING, ED - Abnormal; Notable for the following components:   Glucose-Capillary 106 (*)    All other components within normal limits     PROCEDURES:  Critical Care performed: No  Procedures  IMPRESSION / MDM / ASSESSMENT AND PLAN / ED COURSE  I reviewed the triage vital signs and the nursing notes.                              Differential diagnosis includes, but is not limited to, nonspecific dizziness, volume depletion, electrolyte or metabolic abnormality, anxiety/stress, acute intracranial hemorrhage, aneurysm.  Patient's presentation is most consistent with acute presentation with potential threat to life or bodily function.  Labs/studies ordered: CMP, CBC, CBG  Interventions/Medications given:  Medications - No data to display  (Note:  hospital course my include additional interventions and/or labs/studies not listed above.)   Patient's vital signs are reassuring and her lab work is also reassuring with no evidence of an acute or emergent issue.  Her CO2 was a bit low initially but she was also upset and anxious when she arrived and was likely hyperventilating a bit.  She is stable and asymptomatic now.  On physical exam she has reproducible muscle soreness upon palpation of the  muscles of her upper back and lower neck but no point tenderness of her cervical spine.  I think she is undergoing some stress and anxiety but has no evidence of an acute or emergent medical condition.  I offered imaging such as a CT of the head and even a CTA of the head and neck, but she acknowledged that she has been having intermittent neck pain like this for years (I verified an ED visit for neck pain in 2021) and it actually feels better now that it does sometimes.  She is comfortable with the plan for discharge home and outpatient follow-up.  The patient's medical screening exam is reassuring with no indication of an emergent medical condition requiring hospitalization or additional evaluation at this point.  The patient is safe and appropriate for discharge and outpatient follow up.         FINAL CLINICAL IMPRESSION(S) / ED DIAGNOSES   Final diagnoses:  Lightheadedness  Neck pain  Anxiety     Rx / DC Orders   ED Discharge Orders     None        Note:  This document was prepared using Dragon voice recognition software and may include unintentional dictation errors.   Gordan Huxley, MD 12/29/23 [id number]

## 2023-12-29 NOTE — Discharge Instructions (Signed)
 Your workup in the Emergency Department today was reassuring.  We did not find any specific abnormalities.  We recommend you drink plenty of fluids, take your regular medications and/or any new ones prescribed today, and follow up with the doctor(s) listed in these documents as recommended.  Return to the Emergency Department if you develop new or worsening symptoms that concern you.

## 2023-12-30 ENCOUNTER — Ambulatory Visit: Payer: Self-pay | Admitting: Internal Medicine

## 2024-04-03 ENCOUNTER — Other Ambulatory Visit: Payer: Self-pay | Admitting: Primary Care

## 2024-04-03 DIAGNOSIS — Z30431 Encounter for routine checking of intrauterine contraceptive device: Secondary | ICD-10-CM

## 2024-04-03 DIAGNOSIS — N938 Other specified abnormal uterine and vaginal bleeding: Secondary | ICD-10-CM

## 2024-04-05 ENCOUNTER — Emergency Department
Admission: EM | Admit: 2024-04-05 | Discharge: 2024-04-05 | Disposition: A | Discharge status: Home / Self Care | Attending: Emergency Medicine | Admitting: Emergency Medicine

## 2024-04-05 ENCOUNTER — Other Ambulatory Visit: Payer: Self-pay

## 2024-04-05 DIAGNOSIS — I10 Essential (primary) hypertension: Secondary | ICD-10-CM | POA: Diagnosis not present

## 2024-04-05 DIAGNOSIS — R202 Paresthesia of skin: Secondary | ICD-10-CM | POA: Diagnosis present

## 2024-04-05 LAB — CBC
HCT: 36.1 % (ref 36.0–46.0)
Hemoglobin: 11.4 g/dL — ABNORMAL LOW (ref 12.0–15.0)
MCH: 26.5 pg (ref 26.0–34.0)
MCHC: 31.6 g/dL (ref 30.0–36.0)
MCV: 83.8 fL (ref 80.0–100.0)
Platelets: 390 K/uL (ref 150–400)
RBC: 4.31 MIL/uL (ref 3.87–5.11)
RDW: 13.5 % (ref 11.5–15.5)
WBC: 10 K/uL (ref 4.0–10.5)
nRBC: 0 % (ref 0.0–0.2)

## 2024-04-05 LAB — BASIC METABOLIC PANEL WITH GFR
Anion gap: 12 (ref 5–15)
BUN: 10 mg/dL (ref 6–20)
CO2: 23 mmol/L (ref 22–32)
Calcium: 9.1 mg/dL (ref 8.9–10.3)
Chloride: 105 mmol/L (ref 98–111)
Creatinine, Ser: 0.75 mg/dL (ref 0.44–1.00)
GFR, Estimated: >60 mL/min (ref >60)
Glucose, Bld: 90 mg/dL (ref 70–99)
Potassium: 4 mmol/L (ref 3.5–5.1)
Sodium: 139 mmol/L (ref 135–145)

## 2024-04-05 NOTE — ED Triage Notes (Signed)
 Pt to ED via POV from home. Pt reports has been feeling tingling in her hands and arms all day and reports took BP and highest reading 160s. Pt reports slight HA.

## 2024-04-05 NOTE — Discharge Instructions (Signed)
 Your blood pressure improved in the ER.  You should check your blood pressure once or twice daily for the next week and keep a log of it just to make sure that you are not having consistently high blood pressure readings.  Follow-up with your primary care provider.  Return to the ER for new, worsening, or persistent severe neurologic symptoms, numbness, weakness in your extremities, chest pain, severe headache, difficulty breathing, for persistently severe blood pressures especially over 180 on the top number or 120 on the bottom number, or for any other new or worsening symptoms that concern you.

## 2024-04-05 NOTE — ED Provider Notes (Signed)
 [facility] Provider Note    Event Date/Time   First MD Initiated Contact with Patient 04/05/24 1817     (approximate)   History   Hypertension and Tingling   HPI  Stephanie Christensen is a 38 y.o. female with no significant past medical history who presents with elevated blood sugar.  The patient states that earlier today she had some tingling in bilateral hands and feet that lasted for a few hours intermittently.  Because of this she decided to check her blood pressure and found it to be 160/105.  The patient denies any associated chest pain, difficulty breathing, weakness or lightheadedness, numbness in her extremities, difficulty with balance, or other acute symptoms.  She states that the tingling has resolved.  Her blood pressure is now improved as well.  I reviewed the past medical records.  The patient's most recent prior encounter in our system was on 8/10 with neck pain and near syncope, with a negative lab workup.  She has no other pertinent recent visits.   Physical Exam   Triage Vital Signs: ED Triage Vitals  Encounter Vitals Group     BP 04/05/24 1812 (!) 160/108     Girls Systolic BP Percentile --      Girls Diastolic BP Percentile --      Boys Systolic BP Percentile --      Boys Diastolic BP Percentile --      Pulse Rate 04/05/24 1812 97     Resp 04/05/24 1812 20     Temp 04/05/24 1812 98.5 F (36.9 C)     Temp Source 04/05/24 1812 Oral     SpO2 04/05/24 1812 98 %     Weight --      Height --      Head Circumference --      Peak Flow --      Pain Score 04/05/24 1811 0     Pain Loc --      Pain Education --      Exclude from Growth Chart --     Most recent vital signs: Vitals:   04/05/24 1812 04/05/24 1825  BP: (!) 160/108 (!) 144/95  Pulse: 97 84  Resp: 20 18  Temp: 98.5 F (36.9 C)   SpO2: 98% 100%     General: Alert, well-appearing, no distress.  CV:  Good peripheral perfusion.  Resp:  Normal effort.  Abd:  No  distention.  Other:  5/5 motor strength and intact sensation in bilateral upper and lower extremities.   ED Results / Procedures / Treatments   Labs (all labs ordered are listed, but only abnormal results are displayed) Labs Reviewed  CBC - Abnormal; Notable for the following components:      Result Value   Hemoglobin 11.4 (*)    All other components within normal limits  BASIC METABOLIC PANEL WITH GFR     EKG   RADIOLOGY   PROCEDURES:  Critical Care performed: No  Procedures   MEDICATIONS ORDERED IN ED: Medications - No data to display   IMPRESSION / MDM / ASSESSMENT AND PLAN / ED COURSE  I reviewed the triage vital signs and the nursing notes.  38 year old female with PMH as noted above presents with bilateral paresthesias earlier as well as elevated blood pressure.  The blood pressure is now improved to 130/90 without intervention.  The paresthesias have resolved.  Physical exam is unremarkable.  Differential diagnosis includes, but is not limited to, anxiety, hyperventilation, electrolyte  abnormality, less likely essential hypertension.  The patient denies having significant elevated blood pressure readings in the past.  She is asymptomatic currently.  Will check basic labs and reassess.  Patient's presentation is most consistent with acute complicated illness / injury requiring diagnostic workup.  ----------------------------------------- 7:50 PM on 04/05/2024 -----------------------------------------  Blood pressure remains in the 130s.  The patient remains asymptomatic.  BMP and CBC show no acute findings.  She is stable for discharge home.  I instructed her to keep a log of her blood pressures.  She will follow-up with her primary care provider.  I gave strict return precautions, and she expressed understanding.   FINAL CLINICAL IMPRESSION(S) / ED DIAGNOSES   Final diagnoses:  Hypertension, unspecified type  Paresthesias     Rx / DC Orders   ED  Discharge Orders     None        Note:  This document was prepared using Dragon voice recognition software and may include unintentional dictation errors.    Jacolyn Pae, MD 04/05/24 1950

## 2024-04-07 ENCOUNTER — Ambulatory Visit
Admission: RE | Admit: 2024-04-07 | Discharge: 2024-04-07 | Disposition: A | Discharge status: Home / Self Care | Source: Ambulatory Visit | Attending: Primary Care | Admitting: Primary Care

## 2024-04-07 DIAGNOSIS — Z30431 Encounter for routine checking of intrauterine contraceptive device: Secondary | ICD-10-CM | POA: Diagnosis present

## 2024-04-07 DIAGNOSIS — N938 Other specified abnormal uterine and vaginal bleeding: Secondary | ICD-10-CM | POA: Insufficient documentation
